# Patient Record
Sex: Female | Born: 1955
Health system: Southern US, Community
[De-identification: ages and names within clinical notes are randomized; demographics above are authoritative.]

## PROBLEM LIST (undated history)

## (undated) DIAGNOSIS — I10 Essential (primary) hypertension: Secondary | ICD-10-CM

## (undated) DIAGNOSIS — M1990 Unspecified osteoarthritis, unspecified site: Secondary | ICD-10-CM

## (undated) DIAGNOSIS — S82143A Displaced bicondylar fracture of unspecified tibia, initial encounter for closed fracture: Secondary | ICD-10-CM

## (undated) HISTORY — DX: Displaced bicondylar fracture of unspecified tibia, initial encounter for closed fracture: S82.143A

---

## 2011-09-10 ENCOUNTER — Encounter: Payer: Self-pay | Admitting: Emergency Medicine

## 2011-09-10 ENCOUNTER — Emergency Department (HOSPITAL_COMMUNITY)
Admission: EM | Admit: 2011-09-10 | Discharge: 2011-09-10 | Disposition: A | Discharge status: Home / Self Care | Payer: Self-pay | Attending: Emergency Medicine | Admitting: Emergency Medicine

## 2011-09-10 DIAGNOSIS — G8929 Other chronic pain: Secondary | ICD-10-CM

## 2011-09-10 DIAGNOSIS — M25569 Pain in unspecified knee: Secondary | ICD-10-CM | POA: Insufficient documentation

## 2011-09-10 MED ORDER — TRAMADOL HCL 50 MG PO TABS: 50.0000 mg | ORAL_TABLET | Freq: Four times a day (QID) | ORAL | Status: AC | PRN

## 2011-09-10 NOTE — ED Notes (Signed)
Pt c/o pain in left knee x several weeks that became worse over last couple of days; pt denies injury; pt with limited english but family with pt

## 2011-09-10 NOTE — ED Provider Notes (Signed)
History     CSN: [id number]  Arrival date & time 09/10/11  4098   First MD Initiated Contact with Patient 09/10/11 1105      Chief Complaint  Patient presents with  . Leg Pain    (Consider location/radiation/quality/duration/timing/severity/associated sxs/prior treatment) HPI Comments: Patient is and chronic right knee pain for several months. Apparently in [place] she had fluid drawn out of her knee approximately 4 months ago which led to mild temporary improvement of her symptoms. She reports the fluid was white. She did not have fevers. There is no reported recent injury. Patient had severe pain last night but has improved today. She is seeing some doctor and she is currently on Voltaren. She also has had injections to her knee in the past. She was getting some relief with double tearing, however now it is not as effective. Family member reports that she's not able to sleep much at all last night due to pain. She reports no fever. She reports at times her knee has gotten red and pain usually is worse with long periods of standing and walking. She has not had calf swelling or tenderness. No pleuritic chest pain. No shortness of breath. Currently there is no redness, fever, numbness or weakness of her leg.  Patient is a 56 y.o. female presenting with leg pain. The history is provided by a relative and the patient. The history is limited by a language barrier.  Leg Pain  Pertinent negatives include no numbness.    History reviewed. No pertinent past medical history.  History reviewed. No pertinent past surgical history.  History reviewed. No pertinent family history.  History  Substance Use Topics  . Smoking status: Never Smoker   . Smokeless tobacco: Not on file  . Alcohol Use: No    OB History    Grav Para Term Preterm Abortions TAB SAB Ect Mult Living                  Review of Systems  Constitutional: Negative for fever.  Musculoskeletal: Positive for arthralgias.  Negative for joint swelling.  Neurological: Negative for weakness and numbness.    Allergies  Review of patient's allergies indicates no known allergies.  Home Medications   Current Outpatient Rx  Name Route Sig Dispense Refill  . DICLOFENAC SODIUM 75 MG PO TBEC Oral Take 75 mg by mouth 2 (two) times daily.      . TRAMADOL HCL 50 MG PO TABS Oral Take 1 tablet (50 mg total) by mouth every 6 (six) hours as needed for pain. 15 tablet 0    BP 165/99  Pulse 73  Temp(Src) 97.7 F (36.5 C) (Oral)  Resp 16  SpO2 100%  Physical Exam  Nursing note and vitals reviewed. Constitutional: She appears well-developed and well-nourished. No distress.  Musculoskeletal:       No swelling or deformity of her right knee. There is no rash. There is no obvious effusion. There is full range of motion. Negative Homans sign. No calf swelling or tenderness.  Neurological: She is alert. She has normal strength.  Skin: Skin is warm and dry. No rash noted.    ED Course  Procedures (including critical care time)  Labs Reviewed - No data to display No results found.   1. Chronic knee pain       MDM  Based on description, patient may have pseudogout given the location in the knee and no other joint problems in the past. If the fluid that was  drawn off her knee indeed was white, that applies crystallization. However given that some doctor has been given her injections, there is a possibility of just chronic ongoing degenerative arthritis. Follow up instructions as given to patient and son to follow up with an orthopedist or sports/family physician. Otherwise have given a prescription for tramadol for more severe pain on top of her usual use of Voltaren.       Gavin Pound. Sebastien Jackson, MD 09/10/11 1153

## 2011-09-10 NOTE — Discharge Instructions (Signed)
 I have given you information for the 2 conditions that I think you may have.  Either way, continue taking the Voltaren, take Ultram  for more severe pain and follow up with sports clinic for long term solutions and management.

## 2011-09-18 ENCOUNTER — Ambulatory Visit (HOSPITAL_COMMUNITY)
Admission: RE | Admit: 2011-09-18 | Discharge: 2011-09-18 | Disposition: A | Discharge status: Home / Self Care | Payer: Self-pay | Source: Ambulatory Visit | Attending: Family Medicine | Admitting: Family Medicine

## 2011-09-18 ENCOUNTER — Ambulatory Visit (INDEPENDENT_AMBULATORY_CARE_PROVIDER_SITE_OTHER): Payer: Self-pay | Admitting: Family Medicine

## 2011-09-18 VITALS — BP 150/90 | Ht 69.0 in | Wt 180.0 lb

## 2011-09-18 DIAGNOSIS — X58XXXA Exposure to other specified factors, initial encounter: Secondary | ICD-10-CM | POA: Insufficient documentation

## 2011-09-18 DIAGNOSIS — S82109A Unspecified fracture of upper end of unspecified tibia, initial encounter for closed fracture: Secondary | ICD-10-CM | POA: Insufficient documentation

## 2011-09-18 DIAGNOSIS — M25569 Pain in unspecified knee: Secondary | ICD-10-CM

## 2011-09-18 DIAGNOSIS — M25561 Pain in right knee: Secondary | ICD-10-CM

## 2011-09-18 NOTE — Progress Notes (Signed)
Fluid aspirated from pt's rt knee showed no crystals and few white cells per Bonnie [place], at [facility].

## 2011-09-18 NOTE — Progress Notes (Signed)
Patient Name: Savannah Reese Date of Birth: 01/31/1956 Age: 56 y.o. Medical Record Number: [id number] Gender: female Date of Encounter: 09/18/2011  History of Present Illness:  Savannah Reese is a 56 y.o. very pleasant female patient who presents with the following:  R knee effusion and pain x 10 mo asp  Pleasant African patient from [place] who presents with a 10 month history of subacute RIGHT knee pain. She has no known history of trauma. No known injury. She was seen in [place] by a physician in [place] for 5 months ago. Reportedly she had a aspiration at that point, and the fluid may have been cloudy of some sort. History is complicated by lack of English, that we do have a [place] in the office with her.  She just moved to [place]. She was seen in the emergency room approximately one week ago. She was given some oral anti-inflammatories and pain medicine and asked to followup in the outpatient setting.  She denies a history of gout, pseudogout, or any significant trauma, fracture, or operative intervention the affected joint.  Past Medical History, Surgical History, Social History, Family History, Problem List, Medications, and Allergies have been reviewed and updated if relevant.  Review of Systems:  GEN: No fevers, chills. Nontoxic. Primarily MSK c/o today. MSK: Detailed in the HPI GI: tolerating PO intake without difficulty Neuro: No numbness, parasthesias, or tingling associated. Otherwise the pertinent positives of the ROS are noted above.    Physical Examination: Filed Vitals:   09/18/11 1547  BP: 150/90  Height: 5\' 9"  (1.753 m)  Weight: 180 lb (81.647 kg)    Body mass index is 26.58 kg/(m^2).   GEN: WDWN, NAD, Non-toxic, Alert & Oriented x 3 HEENT: Atraumatic, Normocephalic.  Ears and Nose: No external deformity. EXTR: No clubbing/cyanosis/edema PSYCH: Not depressed or anxious appearing.  Calm demeanor.   RIGHT knee: Large effusion. Nontender with  patellar motion. Nontender with loading the patellar facets. Mildly tender on the medial joint line. Stable to varus and valgus stress, which produces minimal to no pain. Negative anterior and posterior drawer testing. The Murray's causes significant pain. Unable to complete flexion pinch testing due to pain.  Full extension and flexion to 90. Nontender distally along the tibia and fibula. Quadriceps and hamstrings are intact. Strength testing is 5/5 in all of the lower extremity Assessment and Plan: 1. Knee pain, right  DG Knee Bilateral Standing AP, DG Knee 1-2 Views Right    At the time of her office visit, differential diagnosis primarily consisted of osteoarthritis versus gout versus pseudogout. Joint aspirate was examined, and the patient has no crystals.  At the time of aspirate, 9 cc of lidocaine 1% and 1 cc of Depo-Medrol 40 mg was injected with good pain relief.  Knee Aspiration and Injection, Right Patient verbally consented; risks, benefits, and alternatives explained including possible infection. Patient prepped with Chloraprep. Ethyl chloride for anesthesia. 10 cc of 1% Lidocaine used in wheal then injected Subcutaneous fashion with 27 gauge needle on lateral approach. Under sterilne conditions, 18 gauge needle used via lateral approach to aspirate 30 cc of serosanguinous fluid. Then 9 cc of Lidocaine 1% and 1 cc of Depo-Medrol 40 mg injected. Tolerated well, decreased pain, no complications.   After the patient's office visit, plain trauma series was ordered, for review, RIGHT knee. 4 views. There is evidence of an old, subacute medial tibial plateau fracture with some degree of displacement medially and posteriorly.  The patient has no memory of any trauma  or injury. I am going to discuss this case with some of my colleagues - the old / subacute aspect makes more complex case.

## 2011-09-26 ENCOUNTER — Telehealth: Payer: Self-pay | Admitting: *Deleted

## 2011-09-26 NOTE — Telephone Encounter (Signed)
Discussed results with pt's family member as she is non-english speaking.  He will bring pt for appointment with Dr. Patsy Lager 10/02/11 at 3pm for further tx.

## 2011-09-26 NOTE — Telephone Encounter (Signed)
Message copied by Mora Bellman on Wed Sep 26, 2011 10:41 AM ------      Message from: Hannah Beat      Created: Tue Sep 25, 2011  2:43 PM       Can we get her to follow-up next week?            I want to go over everything with her and her family member who speaks english      It looks like she has an old fracture in her knee that we will need to try to work with to support             No crystals in joint at all (definitely no gout)

## 2011-10-02 ENCOUNTER — Ambulatory Visit (INDEPENDENT_AMBULATORY_CARE_PROVIDER_SITE_OTHER): Payer: Self-pay | Admitting: Family Medicine

## 2011-10-02 VITALS — BP 141/89

## 2011-10-02 DIAGNOSIS — S82143A Displaced bicondylar fracture of unspecified tibia, initial encounter for closed fracture: Secondary | ICD-10-CM

## 2011-10-02 DIAGNOSIS — S82109A Unspecified fracture of upper end of unspecified tibia, initial encounter for closed fracture: Secondary | ICD-10-CM

## 2011-10-02 MED ORDER — TRAMADOL HCL 50 MG PO TABS: 50.0000 mg | ORAL_TABLET | Freq: Four times a day (QID) | ORAL | Status: AC | PRN

## 2011-10-02 MED ORDER — TRAMADOL HCL 50 MG PO TABS: 50.0000 mg | ORAL_TABLET | Freq: Four times a day (QID) | ORAL | Status: DC | PRN

## 2011-10-05 NOTE — Progress Notes (Signed)
  Patient Name: Savannah Reese Date of Birth: 1956/01/23 Age: 56 y.o. Medical Record Number: [id number] Gender: female Date of Encounter: 10/02/2011  History of Present Illness:  Savannah Reese is a 56 y.o. very pleasant female patient who presents with the following:  Interpretation from [place].  The patient is brought back into the office to discuss her recent office visit and x-rays. X-rays of the knee, RIGHT, on last office visit showed a mildly displaced medial tibial plateau fracture. Given that the patient has been having significant knee pain for a year and the appearance of the x-ray on plain film, we discussed that this is almost certainly an old fracture that is not fully healed and gone on to bony union. The patient actually feels quite a bit better. Her knee motion is improved. At her last office visit I aspirated her knee, there were no crystals, and we injected some steroids in her knee. Feels quite a bit better she is ambulating much more easily.  Past Medical History, Surgical History, Social History, Family History, Problem List, Medications, and Allergies have been reviewed and updated if relevant.  Review of Systems:  GEN: No fevers, chills. Nontoxic. Primarily MSK c/o today. MSK: Detailed in the HPI GI: tolerating PO intake without difficulty Neuro: No numbness, parasthesias, or tingling associated. Otherwise the pertinent positives of the ROS are noted above.    Physical Examination: Filed Vitals:   10/02/11 1513  BP: 141/89    There is no height or weight on file to calculate BMI.   GEN: WDWN, NAD, Non-toxic, Alert & Oriented x 3 HEENT: Atraumatic, Normocephalic.  Ears and Nose: No external deformity. EXTR: No clubbing/cyanosis/edema NEURO: Normal gait.  PSYCH: Normally interactive. Conversant. Not depressed or anxious appearing.  Calm demeanor.   RIGHT knee: Full extension and flexion to 120. Minimal effusion. Very mild tenderness along the medial joint  line. Stable to varus and obvious stress. Negative Lachman. Negative posterior drawer testing. Negative bounce test.  Assessment and Plan:  1. Fracture of tibial plateau, closed     Date of injury approximately 10 months ago. I suspect that the patient probably had a tibial plateau stress fracture, and we reviewed again her history. She had no history of trauma. We discussed that I suspected that she had a tibial plateau stress fracture and it ultimately went on to an occult fracture. The patient was in [place] at the time. Her 1st x-rays were last week. At this point, she is ambulating with minimal difficulty and her knee actually feels better. I did my best to discuss with her through her cousin that this knee is potentially going to bother her in the future, and this is an intra-articular fracture, and that will predispose her to an acceleration of arthritis. 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 She is going to come back into our office in 6 months or so when she returns from [place], where she will be for at least 6 months. At that point we can recheck another x-ray to see if there is some delayed osseous union. She indicated that she understood everything. For her pain, also gave her some Ultram to use on a p.r.n. basis

## 2011-12-04 ENCOUNTER — Ambulatory Visit (HOSPITAL_COMMUNITY)
Admission: RE | Admit: 2011-12-04 | Discharge: 2011-12-04 | Disposition: A | Discharge status: Home / Self Care | Payer: Self-pay | Source: Ambulatory Visit | Attending: Family Medicine | Admitting: Family Medicine

## 2011-12-04 ENCOUNTER — Ambulatory Visit (INDEPENDENT_AMBULATORY_CARE_PROVIDER_SITE_OTHER): Payer: Self-pay | Admitting: Family Medicine

## 2011-12-04 ENCOUNTER — Encounter: Payer: Self-pay | Admitting: Family Medicine

## 2011-12-04 VITALS — BP 140/90

## 2011-12-04 DIAGNOSIS — S82143A Displaced bicondylar fracture of unspecified tibia, initial encounter for closed fracture: Secondary | ICD-10-CM

## 2011-12-04 DIAGNOSIS — Z4789 Encounter for other orthopedic aftercare: Secondary | ICD-10-CM | POA: Insufficient documentation

## 2011-12-04 DIAGNOSIS — S82109A Unspecified fracture of upper end of unspecified tibia, initial encounter for closed fracture: Secondary | ICD-10-CM

## 2011-12-04 HISTORY — DX: Displaced bicondylar fracture of unspecified tibia, initial encounter for closed fracture: S82.143A

## 2011-12-04 MED ORDER — NAPROXEN 500 MG PO TABS: 500.0000 mg | ORAL_TABLET | Freq: Two times a day (BID) | ORAL | Status: AC

## 2011-12-04 NOTE — Patient Instructions (Signed)
  Alleve 2 tabs by mouth two times a day over the counter: This is equal to a prescripton strength dose that I wrote and with refills (GENERIC CHEAPER EQUIVALENT IS NAPROXEN SODIUM)   Naproxen Sodium 220 mg, 2 tablets twice a day is almost equal to 1 tablet of the prescription twice a day.  If you want to get enough before you go to [place], you can buy bottles of the generic naproxen sodium at [facility]

## 2011-12-04 NOTE — Progress Notes (Signed)
  Patient Name: Savannah Reese Date of Birth: 12-Oct-1955 Age: 56 y.o. Medical Record Number: [id number] Gender: female Date of Encounter: 12/04/2011  History of Present Illness:  Savannah Reese is a 56 y.o. very pleasant female patient who presents with the following:  Right knee prior tibial plateau fracture distantly that occurred in [place] greater than a year ago, delayed diagnosis several months ago. We placed her in a hinged knee brace and gave her some anti-inflammatories and were planning on following this over time. The patient's pain has diminished considerably, but she has had a slight flare up over the last few days. She does have a small amount of fluid on her knee. She has not had any giving way, and she is getting around well wearing her knee brace when she is up and annual waiting for her daily activities.  I presented she probably had a tibial plateau stress fracture, and then completed to a tibial plateau fracture with some mild displacement.  Past Medical History, Surgical History, Social History, Family History, Problem List, Medications, and Allergies have been reviewed and updated if relevant.  Review of Systems:  GEN: No fevers, chills. Nontoxic. Primarily MSK c/o today. MSK: Detailed in the HPI GI: tolerating PO intake without difficulty Neuro: No numbness, parasthesias, or tingling associated. Otherwise the pertinent positives of the ROS are noted above.    Physical Examination: Filed Vitals:   12/04/11 1615  BP: 140/90    There is no height or weight on file to calculate BMI.   GEN: WDWN, NAD, Non-toxic, Alert & Oriented x 3 HEENT: Atraumatic, Normocephalic.  Ears and Nose: No external deformity. EXTR: No clubbing/cyanosis/edema PSYCH: Normally interactive. Conversant. Not depressed or anxious appearing.  Calm demeanor.   Right knee: Mild effusion. Minimal tenderness on the medial joint line. Nontender in the lateral joint line. Full extension. Flexion to  115. Negative anterior and posterior drawer test. Neg mcmurrays.  Assessment and Plan: 1. Closed fracture of tibial plateau  DG Knee Complete 4 Views Right    Clinically, the patient is doing much better compared to her prior evaluations. She is much better motion, significantly less fluid, and much less joint line pain. Clinically this would seem to correspond to some interval healing of her prior distant fracture. The least her knee is much more calm down.  We will recheck a knee series to see if she is having some callus formation or bony union

## 2011-12-06 ENCOUNTER — Encounter: Payer: Self-pay | Admitting: *Deleted

## 2014-03-11 ENCOUNTER — Other Ambulatory Visit: Payer: Self-pay | Admitting: Orthopedic Surgery

## 2014-03-24 NOTE — Pre-Procedure Instructions (Signed)
Mateo FlowVeuve Niandou  03/24/2014   Your procedure is scheduled on:  04/05/14  [facility] at 630 AM.  Call this number if you have problems the morning of surgery: [phone]   Remember:   Do not eat food or drink liquids after midnight.   Take these medicines the morning of surgery with A SIP OF WATER: none   Do not wear jewelry, make-up or nail polish.  Do not wear lotions, powders, or perfumes. You may wear deodorant.  Do not shave 48 hours prior to surgery. Men may shave face and neck.  Do not bring valuables to the hospital.  [facility] is not responsible                  for any belongings or valuables.               Contacts, dentures or bridgework may not be worn into surgery.  Leave suitcase in the car. After surgery it may be brought to your room.  For patients admitted to the hospital, discharge time is determined by your                treatment team.               Patients discharged the day of surgery will not be allowed to drive  home.  Name and phone number of your driver: family  Special Instructions: Incentive Spirometry - Practice and bring it with you on the day of surgery.   Please read over the following fact sheets that you were given: Pain Booklet, Coughing and Deep Breathing, Blood Transfusion Information, MRSA Information and Surgical Site Infection Prevention

## 2014-03-25 ENCOUNTER — Encounter (HOSPITAL_COMMUNITY): Payer: Self-pay

## 2014-03-25 ENCOUNTER — Ambulatory Visit (HOSPITAL_COMMUNITY)
Admission: RE | Admit: 2014-03-25 | Discharge: 2014-03-25 | Disposition: A | Discharge status: Home / Self Care | Payer: Self-pay | Source: Ambulatory Visit | Attending: Orthopedic Surgery | Admitting: Orthopedic Surgery

## 2014-03-25 ENCOUNTER — Encounter (HOSPITAL_COMMUNITY)
Admission: RE | Admit: 2014-03-25 | Discharge: 2014-03-25 | Disposition: A | Discharge status: Home / Self Care | Payer: Self-pay | Source: Ambulatory Visit | Attending: Orthopedic Surgery | Admitting: Orthopedic Surgery

## 2014-03-25 ENCOUNTER — Encounter (HOSPITAL_COMMUNITY): Payer: Self-pay | Admitting: Pharmacy Technician

## 2014-03-25 DIAGNOSIS — Z01810 Encounter for preprocedural cardiovascular examination: Secondary | ICD-10-CM | POA: Insufficient documentation

## 2014-03-25 DIAGNOSIS — E669 Obesity, unspecified: Secondary | ICD-10-CM | POA: Insufficient documentation

## 2014-03-25 DIAGNOSIS — Z01818 Encounter for other preprocedural examination: Secondary | ICD-10-CM | POA: Insufficient documentation

## 2014-03-25 DIAGNOSIS — I10 Essential (primary) hypertension: Secondary | ICD-10-CM | POA: Insufficient documentation

## 2014-03-25 DIAGNOSIS — M4120 Other idiopathic scoliosis, site unspecified: Secondary | ICD-10-CM | POA: Insufficient documentation

## 2014-03-25 HISTORY — DX: Essential (primary) hypertension: I10

## 2014-03-25 HISTORY — DX: Unspecified osteoarthritis, unspecified site: M19.90

## 2014-03-25 LAB — URINALYSIS, ROUTINE W REFLEX MICROSCOPIC
Bilirubin Urine: NEGATIVE
Glucose, UA: NEGATIVE mg/dL
HGB URINE DIPSTICK: NEGATIVE
KETONES UR: NEGATIVE mg/dL
Leukocytes, UA: NEGATIVE
NITRITE: NEGATIVE
PROTEIN: NEGATIVE mg/dL
SPECIFIC GRAVITY, URINE: 1.015 (ref 1.005–1.030)
Urobilinogen, UA: 0.2 mg/dL (ref 0.0–1.0)
pH: 5.5 (ref 5.0–8.0)

## 2014-03-25 LAB — COMPREHENSIVE METABOLIC PANEL
ALT: 8 U/L (ref 0–35)
AST: 13 U/L (ref 0–37)
Albumin: 3.8 g/dL (ref 3.5–5.2)
Alkaline Phosphatase: 43 U/L (ref 39–117)
Anion gap: 14 (ref 5–15)
BUN: 35 mg/dL — AB (ref 6–23)
CALCIUM: 9.1 mg/dL (ref 8.4–10.5)
CO2: 24 meq/L (ref 19–32)
Chloride: 105 mEq/L (ref 96–112)
Creatinine, Ser: 1.26 mg/dL — ABNORMAL HIGH (ref 0.50–1.10)
GFR calc non Af Amer: 45 mL/min — ABNORMAL LOW (ref >90)
GFR, EST AFRICAN AMERICAN: 53 mL/min — AB (ref >90)
Glucose, Bld: 122 mg/dL — ABNORMAL HIGH (ref 70–99)
Potassium: 3.9 mEq/L (ref 3.7–5.3)
Sodium: 143 mEq/L (ref 137–147)
Total Protein: 7.7 g/dL (ref 6.0–8.3)

## 2014-03-25 LAB — ABO/RH: ABO/RH(D): A POS

## 2014-03-25 LAB — CBC WITH DIFFERENTIAL/PLATELET
BASOS ABS: 0 10*3/uL (ref 0.0–0.1)
Basophils Relative: 0 % (ref 0–1)
EOS PCT: 3 % (ref 0–5)
Eosinophils Absolute: 0.2 10*3/uL (ref 0.0–0.7)
HEMATOCRIT: 37.2 % (ref 36.0–46.0)
HEMOGLOBIN: 12.2 g/dL (ref 12.0–15.0)
Lymphocytes Relative: 31 % (ref 12–46)
Lymphs Abs: 2.1 10*3/uL (ref 0.7–4.0)
MCH: 28.3 pg (ref 26.0–34.0)
MCHC: 32.8 g/dL (ref 30.0–36.0)
MCV: 86.3 fL (ref 78.0–100.0)
MONOS PCT: 6 % (ref 3–12)
Monocytes Absolute: 0.4 10*3/uL (ref 0.1–1.0)
Neutro Abs: 4.1 10*3/uL (ref 1.7–7.7)
Neutrophils Relative %: 60 % (ref 43–77)
Platelets: 260 10*3/uL (ref 150–400)
RBC: 4.31 MIL/uL (ref 3.87–5.11)
RDW: 13.7 % (ref 11.5–15.5)
WBC: 6.8 10*3/uL (ref 4.0–10.5)

## 2014-03-25 LAB — APTT: aPTT: 25 seconds (ref 24–37)

## 2014-03-25 LAB — TYPE AND SCREEN
ABO/RH(D): A POS
ANTIBODY SCREEN: NEGATIVE

## 2014-03-25 LAB — PROTIME-INR
INR: 0.93 (ref 0.00–1.49)
PROTHROMBIN TIME: 12.5 s (ref 11.6–15.2)

## 2014-03-25 LAB — SURGICAL PCR SCREEN
MRSA, PCR: NEGATIVE
STAPHYLOCOCCUS AUREUS: NEGATIVE

## 2014-03-25 MED ORDER — SODIUM CHLORIDE 0.9 % IV SOLN: INTRAVENOUS | Status: DC

## 2014-03-25 MED ORDER — CHLORHEXIDINE GLUCONATE 4 % EX LIQD: 60.0000 mL | Freq: Once | CUTANEOUS | Status: DC

## 2014-03-26 NOTE — Progress Notes (Signed)
Anesthesia Chart Review:  Patient is a 58 year old female scheduled for right TKA on 05/02/14 by Dr. Sherlean FootLucey.  History includes non-smoker, HTN, arthritis.  BMI is consistent with obesity.  No PCP is listed.  EKG on 03/25/14 showed SNR, cannot rule out anterior infarct (age undetermined). There are no comparison EKGs available.  CXR on 03/25/14 showed: No active disease. Dextroscoliosis lower thoracic spine.  Preoperative labs noted.   PAT RN notes indicate that her cousin was used to interpret during her PAT interview.  No CV symptoms documented.  No reported CAD, DM, CHF, or MI history.  Further evaluation by her assigned anesthesiologist on the day of surgery. If no acute changes then I would anticipate that she could proceed as planned.  Savannah Ochsllison Dorion Petillo, PA-C [facility]/Anesthesiology Phone [phone] 03/26/2014 12:25 PM

## 2014-04-04 MED ORDER — BUPIVACAINE LIPOSOME 1.3 % IJ SUSP
20.0000 mL | Freq: Once | INTRAMUSCULAR | Status: DC
  Filled 2014-04-04: qty 20

## 2014-04-04 MED ORDER — CEFAZOLIN SODIUM-DEXTROSE 2-3 GM-% IV SOLR
2.0000 g | INTRAVENOUS | Status: DC
  Filled 2014-04-04: qty 50

## 2014-04-04 MED ORDER — TRANEXAMIC ACID 100 MG/ML IV SOLN
1000.0000 mg | INTRAVENOUS | Status: AC
  Administered 2014-04-05: 1000 mg via INTRAVENOUS
  Filled 2014-04-04: qty 10

## 2014-04-05 ENCOUNTER — Encounter (HOSPITAL_COMMUNITY): Payer: Self-pay | Admitting: Vascular Surgery

## 2014-04-05 ENCOUNTER — Inpatient Hospital Stay (HOSPITAL_COMMUNITY)
Admission: RE | Admit: 2014-04-05 | Discharge: 2014-04-08 | DRG: 470 | Disposition: A | Discharge status: Home Health | Payer: Self-pay | Source: Ambulatory Visit | Attending: Orthopedic Surgery | Admitting: Orthopedic Surgery

## 2014-04-05 ENCOUNTER — Encounter (HOSPITAL_COMMUNITY): Payer: Self-pay | Admitting: *Deleted

## 2014-04-05 ENCOUNTER — Inpatient Hospital Stay (HOSPITAL_COMMUNITY): Payer: Self-pay | Admitting: Anesthesiology

## 2014-04-05 ENCOUNTER — Encounter (HOSPITAL_COMMUNITY)
Admission: RE | Disposition: A | Discharge status: Home Health | Payer: Self-pay | Source: Ambulatory Visit | Attending: Orthopedic Surgery

## 2014-04-05 DIAGNOSIS — M1710 Unilateral primary osteoarthritis, unspecified knee: Principal | ICD-10-CM | POA: Diagnosis present

## 2014-04-05 DIAGNOSIS — D62 Acute posthemorrhagic anemia: Secondary | ICD-10-CM | POA: Diagnosis not present

## 2014-04-05 DIAGNOSIS — I10 Essential (primary) hypertension: Secondary | ICD-10-CM | POA: Diagnosis present

## 2014-04-05 DIAGNOSIS — Z96651 Presence of right artificial knee joint: Secondary | ICD-10-CM

## 2014-04-05 DIAGNOSIS — Z96659 Presence of unspecified artificial knee joint: Secondary | ICD-10-CM

## 2014-04-05 HISTORY — PX: TOTAL KNEE ARTHROPLASTY: SHX125

## 2014-04-05 LAB — URINE CULTURE: Colony Count: 5000

## 2014-04-05 LAB — CREATININE, SERUM
Creatinine, Ser: 1.05 mg/dL (ref 0.50–1.10)
GFR calc Af Amer: 66 mL/min — ABNORMAL LOW (ref >90)
GFR calc non Af Amer: 57 mL/min — ABNORMAL LOW (ref >90)

## 2014-04-05 LAB — CBC
HCT: 36.8 % (ref 36.0–46.0)
HEMOGLOBIN: 12 g/dL (ref 12.0–15.0)
MCH: 28.2 pg (ref 26.0–34.0)
MCHC: 32.6 g/dL (ref 30.0–36.0)
MCV: 86.4 fL (ref 78.0–100.0)
PLATELETS: 270 10*3/uL (ref 150–400)
RBC: 4.26 MIL/uL (ref 3.87–5.11)
RDW: 14 % (ref 11.5–15.5)
WBC: 10.1 10*3/uL (ref 4.0–10.5)

## 2014-04-05 SURGERY — ARTHROPLASTY, KNEE, TOTAL
Anesthesia: General | Site: Knee | Laterality: Right

## 2014-04-05 MED ORDER — SODIUM CHLORIDE 0.9 % IR SOLN
Status: DC | PRN
  Administered 2014-04-05: 3000 mL

## 2014-04-05 MED ORDER — FENTANYL CITRATE 0.05 MG/ML IJ SOLN
INTRAMUSCULAR | Status: AC
  Filled 2014-04-05: qty 2

## 2014-04-05 MED ORDER — LISINOPRIL 20 MG PO TABS
20.0000 mg | ORAL_TABLET | Freq: Every day | ORAL | Status: DC
  Administered 2014-04-05 – 2014-04-08 (×4): 20 mg via ORAL
  Filled 2014-04-05 (×4): qty 1

## 2014-04-05 MED ORDER — DIPHENHYDRAMINE HCL 12.5 MG/5ML PO ELIX: 12.5000 mg | ORAL_SOLUTION | ORAL | Status: DC | PRN

## 2014-04-05 MED ORDER — MIDAZOLAM HCL 2 MG/2ML IJ SOLN
2.0000 mg | Freq: Once | INTRAMUSCULAR | Status: DC
  Filled 2014-04-05: qty 2

## 2014-04-05 MED ORDER — MEPERIDINE HCL 25 MG/ML IJ SOLN: 6.2500 mg | INTRAMUSCULAR | Status: DC | PRN

## 2014-04-05 MED ORDER — 0.9 % SODIUM CHLORIDE (POUR BTL) OPTIME
TOPICAL | Status: DC | PRN
  Administered 2014-04-05: 1000 mL

## 2014-04-05 MED ORDER — PROPOFOL 10 MG/ML IV BOLUS
INTRAVENOUS | Status: AC
  Filled 2014-04-05: qty 20

## 2014-04-05 MED ORDER — METHOCARBAMOL 500 MG PO TABS
500.0000 mg | ORAL_TABLET | Freq: Four times a day (QID) | ORAL | Status: DC | PRN
  Administered 2014-04-06 – 2014-04-07 (×4): 500 mg via ORAL
  Filled 2014-04-05 (×5): qty 1

## 2014-04-05 MED ORDER — METHOCARBAMOL 1000 MG/10ML IJ SOLN
500.0000 mg | Freq: Four times a day (QID) | INTRAMUSCULAR | Status: DC | PRN
  Filled 2014-04-05: qty 5

## 2014-04-05 MED ORDER — LIDOCAINE HCL (CARDIAC) 20 MG/ML IV SOLN
INTRAVENOUS | Status: DC | PRN
  Administered 2014-04-05: 100 mg via INTRAVENOUS

## 2014-04-05 MED ORDER — SODIUM CHLORIDE 0.9 % IV SOLN
INTRAVENOUS | Status: DC | PRN
  Administered 2014-04-05 (×2): via INTRAVENOUS

## 2014-04-05 MED ORDER — ONDANSETRON HCL 4 MG/2ML IJ SOLN
INTRAMUSCULAR | Status: DC | PRN
  Administered 2014-04-05: 4 mg via INTRAVENOUS

## 2014-04-05 MED ORDER — FLEET ENEMA 7-19 GM/118ML RE ENEM: 1.0000 | ENEMA | Freq: Once | RECTAL | Status: AC | PRN

## 2014-04-05 MED ORDER — OXYCODONE HCL ER 10 MG PO T12A
10.0000 mg | EXTENDED_RELEASE_TABLET | Freq: Two times a day (BID) | ORAL | Status: DC
  Administered 2014-04-05 – 2014-04-08 (×7): 10 mg via ORAL
  Filled 2014-04-05 (×7): qty 1

## 2014-04-05 MED ORDER — ONDANSETRON HCL 4 MG PO TABS: 4.0000 mg | ORAL_TABLET | Freq: Four times a day (QID) | ORAL | Status: DC | PRN

## 2014-04-05 MED ORDER — ATENOLOL-CHLORTHALIDONE 50-25 MG PO TABS: 1.0000 | ORAL_TABLET | Freq: Every day | ORAL | Status: DC

## 2014-04-05 MED ORDER — BISACODYL 5 MG PO TBEC: 5.0000 mg | DELAYED_RELEASE_TABLET | Freq: Every day | ORAL | Status: DC | PRN

## 2014-04-05 MED ORDER — STERILE WATER FOR INJECTION IJ SOLN
INTRAMUSCULAR | Status: AC
  Filled 2014-04-05: qty 10

## 2014-04-05 MED ORDER — MENTHOL 3 MG MT LOZG
1.0000 | LOZENGE | OROMUCOSAL | Status: DC | PRN
Start: 2014-04-05 — End: 2014-04-08

## 2014-04-05 MED ORDER — DEXAMETHASONE SODIUM PHOSPHATE 4 MG/ML IJ SOLN
INTRAMUSCULAR | Status: AC
  Filled 2014-04-05: qty 1

## 2014-04-05 MED ORDER — DOCUSATE SODIUM 100 MG PO CAPS
100.0000 mg | ORAL_CAPSULE | Freq: Two times a day (BID) | ORAL | Status: DC
  Administered 2014-04-05 – 2014-04-08 (×7): 100 mg via ORAL
  Filled 2014-04-05 (×8): qty 1

## 2014-04-05 MED ORDER — PHENOL 1.4 % MT LIQD: 1.0000 | OROMUCOSAL | Status: DC | PRN

## 2014-04-05 MED ORDER — ACETAMINOPHEN 650 MG RE SUPP: 650.0000 mg | Freq: Four times a day (QID) | RECTAL | Status: DC | PRN

## 2014-04-05 MED ORDER — DEXAMETHASONE SODIUM PHOSPHATE 4 MG/ML IJ SOLN
INTRAMUSCULAR | Status: DC | PRN
  Administered 2014-04-05: 4 mg via INTRAVENOUS

## 2014-04-05 MED ORDER — SUCCINYLCHOLINE CHLORIDE 20 MG/ML IJ SOLN
INTRAMUSCULAR | Status: AC
  Filled 2014-04-05: qty 1

## 2014-04-05 MED ORDER — OXYCODONE HCL 5 MG PO TABS: 5.0000 mg | ORAL_TABLET | Freq: Once | ORAL | Status: DC | PRN

## 2014-04-05 MED ORDER — OXYCODONE HCL 5 MG PO TABS
5.0000 mg | ORAL_TABLET | ORAL | Status: DC | PRN
  Administered 2014-04-05 – 2014-04-08 (×13): 10 mg via ORAL
  Filled 2014-04-05 (×13): qty 2

## 2014-04-05 MED ORDER — ATENOLOL 50 MG PO TABS
50.0000 mg | ORAL_TABLET | Freq: Every day | ORAL | Status: DC
  Administered 2014-04-06 – 2014-04-08 (×3): 50 mg via ORAL
  Filled 2014-04-05 (×3): qty 1

## 2014-04-05 MED ORDER — SODIUM CHLORIDE 0.9 % IV SOLN
INTRAVENOUS | Status: DC
Start: 2014-04-05 — End: 2014-04-08
  Administered 2014-04-05 – 2014-04-06 (×3): via INTRAVENOUS

## 2014-04-05 MED ORDER — ARTIFICIAL TEARS OP OINT
TOPICAL_OINTMENT | OPHTHALMIC | Status: AC
  Filled 2014-04-05: qty 10.5

## 2014-04-05 MED ORDER — PHENYLEPHRINE HCL 10 MG/ML IJ SOLN
INTRAMUSCULAR | Status: DC | PRN
  Administered 2014-04-05 (×2): 40 ug via INTRAVENOUS

## 2014-04-05 MED ORDER — METOCLOPRAMIDE HCL 5 MG/ML IJ SOLN: 5.0000 mg | Freq: Three times a day (TID) | INTRAMUSCULAR | Status: DC | PRN

## 2014-04-05 MED ORDER — FENTANYL CITRATE 0.05 MG/ML IJ SOLN
INTRAMUSCULAR | Status: DC | PRN
  Administered 2014-04-05: 50 ug via INTRAVENOUS
  Administered 2014-04-05 (×8): 25 ug via INTRAVENOUS

## 2014-04-05 MED ORDER — BUPIVACAINE-EPINEPHRINE (PF) 0.5% -1:200000 IJ SOLN
INTRAMUSCULAR | Status: AC
  Filled 2014-04-05: qty 30

## 2014-04-05 MED ORDER — EPHEDRINE SULFATE 50 MG/ML IJ SOLN
INTRAMUSCULAR | Status: AC
  Filled 2014-04-05: qty 1

## 2014-04-05 MED ORDER — CEFAZOLIN SODIUM-DEXTROSE 2-3 GM-% IV SOLR
2.0000 g | Freq: Four times a day (QID) | INTRAVENOUS | Status: AC
  Administered 2014-04-05 (×2): 2 g via INTRAVENOUS
  Filled 2014-04-05 (×2): qty 50

## 2014-04-05 MED ORDER — BUPIVACAINE-EPINEPHRINE (PF) 0.5% -1:200000 IJ SOLN
INTRAMUSCULAR | Status: DC | PRN
  Administered 2014-04-05: 30 mL via PERINEURAL

## 2014-04-05 MED ORDER — ZOLPIDEM TARTRATE 5 MG PO TABS
5.0000 mg | ORAL_TABLET | Freq: Every evening | ORAL | Status: DC | PRN
Start: 2014-04-05 — End: 2014-04-08

## 2014-04-05 MED ORDER — FENTANYL CITRATE 0.05 MG/ML IJ SOLN
INTRAMUSCULAR | Status: AC
  Filled 2014-04-05: qty 5

## 2014-04-05 MED ORDER — ENOXAPARIN SODIUM 30 MG/0.3ML ~~LOC~~ SOLN
30.0000 mg | Freq: Two times a day (BID) | SUBCUTANEOUS | Status: DC
  Administered 2014-04-06 – 2014-04-08 (×5): 30 mg via SUBCUTANEOUS
  Filled 2014-04-05 (×7): qty 0.3

## 2014-04-05 MED ORDER — ONDANSETRON HCL 4 MG/2ML IJ SOLN: 4.0000 mg | Freq: Once | INTRAMUSCULAR | Status: DC | PRN

## 2014-04-05 MED ORDER — FENTANYL CITRATE 0.05 MG/ML IJ SOLN
50.0000 ug | Freq: Once | INTRAMUSCULAR | Status: AC
  Administered 2014-04-05: 50 ug via INTRAVENOUS

## 2014-04-05 MED ORDER — HYDROMORPHONE HCL PF 1 MG/ML IJ SOLN
1.0000 mg | INTRAMUSCULAR | Status: DC | PRN
  Administered 2014-04-05: 1 mg via INTRAVENOUS
  Filled 2014-04-05: qty 1

## 2014-04-05 MED ORDER — METOCLOPRAMIDE HCL 5 MG PO TABS
5.0000 mg | ORAL_TABLET | Freq: Three times a day (TID) | ORAL | Status: DC | PRN
  Filled 2014-04-05: qty 2

## 2014-04-05 MED ORDER — CHLORTHALIDONE 25 MG PO TABS
25.0000 mg | ORAL_TABLET | Freq: Every day | ORAL | Status: DC
  Administered 2014-04-06 – 2014-04-08 (×3): 25 mg via ORAL
  Filled 2014-04-05 (×3): qty 1

## 2014-04-05 MED ORDER — IBUPROFEN 800 MG PO TABS
800.0000 mg | ORAL_TABLET | Freq: Three times a day (TID) | ORAL | Status: DC | PRN
  Filled 2014-04-05: qty 1

## 2014-04-05 MED ORDER — SODIUM CHLORIDE 0.9 % IV SOLN: INTRAVENOUS | Status: DC

## 2014-04-05 MED ORDER — SIMVASTATIN 20 MG PO TABS
20.0000 mg | ORAL_TABLET | Freq: Every day | ORAL | Status: DC
  Administered 2014-04-05 – 2014-04-06 (×2): 20 mg via ORAL
  Filled 2014-04-05 (×4): qty 1

## 2014-04-05 MED ORDER — BUPIVACAINE-EPINEPHRINE 0.5% -1:200000 IJ SOLN
INTRAMUSCULAR | Status: DC | PRN
  Administered 2014-04-05: 30 mL

## 2014-04-05 MED ORDER — ONDANSETRON HCL 4 MG/2ML IJ SOLN: 4.0000 mg | Freq: Four times a day (QID) | INTRAMUSCULAR | Status: DC | PRN

## 2014-04-05 MED ORDER — ROCURONIUM BROMIDE 50 MG/5ML IV SOLN
INTRAVENOUS | Status: AC
  Filled 2014-04-05: qty 1

## 2014-04-05 MED ORDER — OXYCODONE HCL 5 MG/5ML PO SOLN: 5.0000 mg | Freq: Once | ORAL | Status: DC | PRN

## 2014-04-05 MED ORDER — SENNOSIDES-DOCUSATE SODIUM 8.6-50 MG PO TABS: 1.0000 | ORAL_TABLET | Freq: Every evening | ORAL | Status: DC | PRN

## 2014-04-05 MED ORDER — AMLODIPINE BESYLATE 5 MG PO TABS
5.0000 mg | ORAL_TABLET | Freq: Every day | ORAL | Status: DC
  Administered 2014-04-05 – 2014-04-07 (×3): 5 mg via ORAL
  Filled 2014-04-05 (×4): qty 1

## 2014-04-05 MED ORDER — HYDROMORPHONE HCL PF 1 MG/ML IJ SOLN
0.2500 mg | INTRAMUSCULAR | Status: DC | PRN
  Administered 2014-04-05: 0.25 mg via INTRAVENOUS
  Administered 2014-04-05: 0.5 mg via INTRAVENOUS
  Administered 2014-04-05: 0.25 mg via INTRAVENOUS

## 2014-04-05 MED ORDER — PHENYLEPHRINE 40 MCG/ML (10ML) SYRINGE FOR IV PUSH (FOR BLOOD PRESSURE SUPPORT)
PREFILLED_SYRINGE | INTRAVENOUS | Status: AC
  Filled 2014-04-05: qty 10

## 2014-04-05 MED ORDER — PROPOFOL 10 MG/ML IV BOLUS
INTRAVENOUS | Status: DC | PRN
  Administered 2014-04-05: 200 mg via INTRAVENOUS

## 2014-04-05 MED ORDER — ONDANSETRON HCL 4 MG/2ML IJ SOLN
INTRAMUSCULAR | Status: AC
  Filled 2014-04-05: qty 4

## 2014-04-05 MED ORDER — ACETAMINOPHEN 325 MG PO TABS
650.0000 mg | ORAL_TABLET | Freq: Four times a day (QID) | ORAL | Status: DC | PRN
  Administered 2014-04-07 – 2014-04-08 (×2): 650 mg via ORAL
  Filled 2014-04-05 (×2): qty 2

## 2014-04-05 MED ORDER — MIDAZOLAM HCL 2 MG/2ML IJ SOLN
INTRAMUSCULAR | Status: AC
  Administered 2014-04-05: 2 mg
  Filled 2014-04-05: qty 2

## 2014-04-05 MED ORDER — LIDOCAINE HCL (CARDIAC) 20 MG/ML IV SOLN
INTRAVENOUS | Status: AC
  Filled 2014-04-05: qty 5

## 2014-04-05 MED ORDER — HYDROMORPHONE HCL PF 1 MG/ML IJ SOLN
INTRAMUSCULAR | Status: AC
  Administered 2014-04-05: 0.25 mg via INTRAVENOUS
  Filled 2014-04-05: qty 1

## 2014-04-05 MED ORDER — MIDAZOLAM HCL 2 MG/2ML IJ SOLN
INTRAMUSCULAR | Status: AC
  Filled 2014-04-05: qty 2

## 2014-04-05 MED ORDER — CEFAZOLIN SODIUM-DEXTROSE 2-3 GM-% IV SOLR
2.0000 g | Freq: Once | INTRAVENOUS | Status: AC
  Administered 2014-04-05: 2 g via INTRAVENOUS

## 2014-04-05 MED ORDER — ALUM & MAG HYDROXIDE-SIMETH 200-200-20 MG/5ML PO SUSP: 30.0000 mL | ORAL | Status: DC | PRN

## 2014-04-05 SURGICAL SUPPLY — 56 items
BANDAGE ESMARK 6X9 LF (GAUZE/BANDAGES/DRESSINGS) ×1 IMPLANT
BLADE SAGITTAL 13X1.27X60 (BLADE) ×2 IMPLANT
BLADE SAW SGTL 83.5X18.5 (BLADE) ×2 IMPLANT
BNDG ESMARK 6X9 LF (GAUZE/BANDAGES/DRESSINGS) ×2
BOWL SMART MIX CTS (DISPOSABLE) ×2 IMPLANT
CAP POR NKTM CP VIT E LN CER ×2 IMPLANT
CEMENT BONE SIMPLEX SPEEDSET (Cement) ×4 IMPLANT
COVER SURGICAL LIGHT HANDLE (MISCELLANEOUS) ×2 IMPLANT
CUFF TOURNIQUET SINGLE 34IN LL (TOURNIQUET CUFF) ×2 IMPLANT
DRAPE EXTREMITY T 121X128X90 (DRAPE) ×2 IMPLANT
DRAPE INCISE IOBAN 66X45 STRL (DRAPES) ×4 IMPLANT
DRAPE PROXIMA HALF (DRAPES) ×2 IMPLANT
DRAPE U-SHAPE 47X51 STRL (DRAPES) ×2 IMPLANT
DRSG ADAPTIC 3X8 NADH LF (GAUZE/BANDAGES/DRESSINGS) ×2 IMPLANT
DRSG PAD ABDOMINAL 8X10 ST (GAUZE/BANDAGES/DRESSINGS) ×2 IMPLANT
DURAPREP 26ML APPLICATOR (WOUND CARE) ×2 IMPLANT
ELECT REM PT RETURN 9FT ADLT (ELECTROSURGICAL) ×2
ELECTRODE REM PT RTRN 9FT ADLT (ELECTROSURGICAL) ×1 IMPLANT
EVACUATOR 1/8 PVC DRAIN (DRAIN) ×2 IMPLANT
GAUZE SPONGE 4X4 12PLY STRL (GAUZE/BANDAGES/DRESSINGS) ×2 IMPLANT
GLOVE BIOGEL M 7.0 STRL (GLOVE) IMPLANT
GLOVE BIOGEL PI IND STRL 7.5 (GLOVE) IMPLANT
GLOVE BIOGEL PI IND STRL 8.5 (GLOVE) ×2 IMPLANT
GLOVE BIOGEL PI INDICATOR 7.5 (GLOVE)
GLOVE BIOGEL PI INDICATOR 8.5 (GLOVE) ×2
GLOVE SURG ORTHO 8.0 STRL STRW (GLOVE) ×4 IMPLANT
GOWN STRL REUS W/ TWL LRG LVL3 (GOWN DISPOSABLE) ×2 IMPLANT
GOWN STRL REUS W/ TWL XL LVL3 (GOWN DISPOSABLE) ×2 IMPLANT
GOWN STRL REUS W/TWL LRG LVL3 (GOWN DISPOSABLE) ×2
GOWN STRL REUS W/TWL XL LVL3 (GOWN DISPOSABLE) ×2
HANDPIECE INTERPULSE COAX TIP (DISPOSABLE) ×1
HOOD PEEL AWAY FACE SHEILD DIS (HOOD) ×6 IMPLANT
KIT BASIN OR (CUSTOM PROCEDURE TRAY) ×2 IMPLANT
KIT ROOM TURNOVER OR (KITS) ×2 IMPLANT
MANIFOLD NEPTUNE II (INSTRUMENTS) ×2 IMPLANT
NEEDLE 22X1 1/2 (OR ONLY) (NEEDLE) ×4 IMPLANT
NS IRRIG 1000ML POUR BTL (IV SOLUTION) ×2 IMPLANT
PACK TOTAL JOINT (CUSTOM PROCEDURE TRAY) ×2 IMPLANT
PAD ABD 8X10 STRL (GAUZE/BANDAGES/DRESSINGS) ×2 IMPLANT
PAD ARMBOARD 7.5X6 YLW CONV (MISCELLANEOUS) ×4 IMPLANT
PADDING CAST COTTON 6X4 STRL (CAST SUPPLIES) ×2 IMPLANT
SET HNDPC FAN SPRY TIP SCT (DISPOSABLE) ×1 IMPLANT
SPONGE GAUZE 4X4 12PLY STER LF (GAUZE/BANDAGES/DRESSINGS) ×2 IMPLANT
STAPLER VISISTAT 35W (STAPLE) ×2 IMPLANT
SUCTION FRAZIER TIP 10 FR DISP (SUCTIONS) ×2 IMPLANT
SUT BONE WAX W31G (SUTURE) ×2 IMPLANT
SUT VIC AB 0 CTB1 27 (SUTURE) ×4 IMPLANT
SUT VIC AB 1 CT1 27 (SUTURE) ×3
SUT VIC AB 1 CT1 27XBRD ANBCTR (SUTURE) ×3 IMPLANT
SUT VIC AB 2-0 CT1 27 (SUTURE) ×2
SUT VIC AB 2-0 CT1 TAPERPNT 27 (SUTURE) ×2 IMPLANT
SYR 20CC LL (SYRINGE) ×4 IMPLANT
TOWEL OR 17X24 6PK STRL BLUE (TOWEL DISPOSABLE) ×2 IMPLANT
TOWEL OR 17X26 10 PK STRL BLUE (TOWEL DISPOSABLE) ×2 IMPLANT
TRAY FOLEY CATH 14FR (SET/KITS/TRAYS/PACK) ×2 IMPLANT
WATER STERILE IRR 1000ML POUR (IV SOLUTION) ×4 IMPLANT

## 2014-04-05 NOTE — Evaluation (Signed)
Physical Therapy Evaluation Patient Details Name: Savannah Reese MRN: [id number] DOB: 1956/05/25 Today's Date: 04/05/2014   History of Present Illness  58 y.o. female s/p right total knee arthroplasty. Hx of HTN.  Clinical Impression  Pt is s/p right TKA presenting with the deficits listed below (see PT Problem List). Tolerated gait and transfer training in room today with moderate knee buckling requiring min assist to stabilize, post op day #0. Pt will benefit from skilled PT to increase their independence and safety with mobility to allow discharge to the venue listed below.      Follow Up Recommendations Home health PT;Supervision for mobility/OOB    Equipment Recommendations  Rolling walker with 5" wheels;3in1 (PT)    Recommendations for Other Services OT consult     Precautions / Restrictions Precautions Precautions: Knee Precaution Comments: Reviewed knee precautions Restrictions Weight Bearing Restrictions: Yes RLE Weight Bearing: Weight bearing as tolerated      Mobility  Bed Mobility Overal bed mobility: Needs Assistance Bed Mobility: Supine to Sit     Supine to sit: Min guard     General bed mobility comments: Min guard for safety. VC for technique.  Transfers Overall transfer level: Needs assistance Equipment used: Rolling walker (2 wheeled) Transfers: Sit to/from Stand Sit to Stand: Min assist         General transfer comment: Min assist for boost from lowest bed setting. Verbal and tactile cues for hand placement. Requires extra time.  Ambulation/Gait Ambulation/Gait assistance: Min assist Ambulation Distance (Feet): 10 Feet Assistive device: Rolling walker (2 wheeled) Gait Pattern/deviations: Step-to pattern;Decreased step length - left;Decreased stance time - right;Antalgic   Gait velocity interpretation: Below normal speed for age/gender General Gait Details: Educated on safe DME use and sequencing of gait. Tactile cues for quad activation in  right stance phase. Moderate instability with buckling noted but shows good UE strength. Min assist to block RLE from buckling at knee.  Stairs            Wheelchair Mobility    Modified Rankin (Stroke Patients Only)       Balance Overall balance assessment: Needs assistance Sitting-balance support: Feet supported;No upper extremity supported Sitting balance-Leahy Scale: Good     Standing balance support: Single extremity supported Standing balance-Leahy Scale: Poor                               Pertinent Vitals/Pain Pt reports pain as "low" per daughter's translation but would like pain medication Nurse notified Patient repositioned in chair for comfort.     Home Living Family/patient expects to be discharged to:: Private residence Living Arrangements: Other relatives (Cousin) Available Help at Discharge: Family;Available 24 hours/day Type of Home: Apartment Home Access: Level entry     Home Layout: One level Home Equipment: None      Prior Function Level of Independence: Independent with assistive device(s)         Comments: Ambulates with SPC. Could bath/dress herself.     Hand Dominance        Extremity/Trunk Assessment   Upper Extremity Assessment: Defer to OT evaluation           Lower Extremity Assessment: RLE deficits/detail RLE Deficits / Details: decreased strength and ROM as expected post-op       Communication   Communication: Prefers language other than English ([place] - daughter speaks english)  Cognition Arousal/Alertness: Awake/alert Behavior During Therapy: WFL for tasks assessed/performed Overall Cognitive  Status: Difficult to assess (Speaks language other than english)                      General Comments      Exercises Total Joint Exercises Ankle Circles/Pumps: AROM;Both;10 reps;Supine Quad Sets: AROM;Right;10 reps;Supine      Assessment/Plan    PT Assessment Patient needs continued PT  services  PT Diagnosis Difficulty walking;Abnormality of gait;Acute pain   PT Problem List Decreased strength;Decreased range of motion;Decreased activity tolerance;Decreased balance;Decreased mobility;Decreased knowledge of use of DME;Decreased knowledge of precautions;Pain  PT Treatment Interventions DME instruction;Gait training;Functional mobility training;Therapeutic activities;Therapeutic exercise;Balance training;Neuromuscular re-education;Patient/family education;Modalities   PT Goals (Current goals can be found in the Care Plan section) Acute Rehab PT Goals Patient Stated Goal: No pain PT Goal Formulation: With patient Time For Goal Achievement: 04/12/14 Potential to Achieve Goals: Good    Frequency 7X/week   Barriers to discharge        Co-evaluation               End of Session   Activity Tolerance: Patient tolerated treatment well Patient left: in chair;with call bell/phone within reach;with family/visitor present;with nursing/sitter in room Nurse Communication: Mobility status;Precautions;Other (comment) (stand pivot transfer back to bed- Rt knee instability)         Time: [id number] PT Time Calculation (min): 28 min   Charges:   PT Evaluation $Initial PT Evaluation Tier I: 1 Procedure PT Treatments $Gait Training: 8-22 mins   PT G Codes:        Charlsie MerlesLogan Secor Amiaya Mcneeley, [place]PT [phone]   Berton Mount[place]bour, Kaloni Bisaillon S 04/05/2014, 5:25 PM

## 2014-04-05 NOTE — Anesthesia Postprocedure Evaluation (Signed)
Anesthesia Post Note  Patient: Savannah Reese  Procedure(s) Performed: Procedure(s) (LRB): TOTAL KNEE ARTHROPLASTY (Right)  Anesthesia type: general  Patient location: PACU  Post pain: Pain level controlled  Post assessment: Patient's Cardiovascular Status Stable  Last Vitals:  Filed Vitals:   04/05/14 1215  BP: 136/84  Pulse: 74  Temp:   Resp: 9    Post vital signs: Reviewed and stable  Level of consciousness: sedated  Complications: No apparent anesthesia complications

## 2014-04-05 NOTE — H&P (Signed)
°  Savannah Reese MRN:  [id number] DOB/SEX:  10/13/1955/female  CHIEF COMPLAINT:  Painful right Knee  HISTORY: Patient is a 58 y.o. female presented with a history of pain in the right knee. Onset of symptoms was gradual starting several years ago with gradually worsening course since that time. Prior procedures on the knee include none. Patient has been treated conservatively with over-the-counter NSAIDs and activity modification. Patient currently rates pain in the knee at 10 out of 10 with activity. There is pain at night.  PAST MEDICAL HISTORY: There are no active problems to display for this patient.  Past Medical History  Diagnosis Date   Hypertension    Arthritis    Past Surgical History  Procedure Laterality Date   No past surgeries       MEDICATIONS:   Prescriptions prior to admission  Medication Sig Dispense Refill   amLODipine (NORVASC) 5 MG tablet Take 5 mg by mouth at bedtime.       atenolol-chlorthalidone (TENORETIC) 50-25 MG per tablet Take 1 tablet by mouth daily.       ibuprofen (ADVIL,MOTRIN) 800 MG tablet Take 800 mg by mouth 3 (three) times daily as needed for mild pain.       lisinopril (PRINIVIL,ZESTRIL) 20 MG tablet Take 20 mg by mouth daily.       pravastatin (PRAVACHOL) 40 MG tablet Take 40 mg by mouth at bedtime.        ALLERGIES:  Not on File  REVIEW OF SYSTEMS:  A comprehensive review of systems was negative.   FAMILY HISTORY:  No family history on file.  SOCIAL HISTORY:   History  Substance Use Topics   Smoking status: Never Smoker    Smokeless tobacco: Not on file   Alcohol Use: No     EXAMINATION:  Vital signs in last 24 hours:    General appearance: alert, cooperative and no distress Lungs: clear to auscultation bilaterally Heart: regular rate and rhythm, S1, S2 normal, no murmur, click, rub or gallop Abdomen: soft, non-tender; bowel sounds normal; no masses,  no organomegaly Extremities: extremities normal, atraumatic, no  cyanosis or edema and Homans sign is negative, no sign of DVT Pulses: 2+ and symmetric Skin: Skin color, texture, turgor normal. No rashes or lesions Neurologic: Alert and oriented X 3, normal strength and tone. Normal symmetric reflexes. Normal coordination and gait  Musculoskeletal:  ROM 0-100, Ligaments intact,  Imaging Review Plain radiographs demonstrate severe degenerative joint disease of the right knee. The overall alignment is significant valgus. The bone quality appears to be good for age and reported activity level.  Assessment/Plan: End stage arthritis, right knee   The patient history, physical examination and imaging studies are consistent with advanced degenerative joint disease of the right knee. The patient has failed conservative treatment.  The clearance notes were reviewed.  After discussion with the patient it was felt that Total Knee Replacement was indicated. The procedure,  risks, and benefits of total knee arthroplasty were presented and reviewed. The risks including but not limited to aseptic loosening, infection, blood clots, vascular injury, stiffness, patella tracking problems complications among others were discussed. The patient acknowledged the explanation, agreed to proceed with the plan.  Savannah Reese 04/05/2014, 6:46 AM

## 2014-04-05 NOTE — Transfer of Care (Signed)
Immediate Anesthesia Transfer of Care Note  Patient: Savannah Reese  Procedure(s) Performed: Procedure(s): TOTAL KNEE ARTHROPLASTY (Right)  Patient Location: PACU  Anesthesia Type:General  Level of Consciousness: awake, alert  and oriented  Airway & Oxygen Therapy: Patient Spontanous Breathing and Patient connected to nasal cannula oxygen  Post-op Assessment: Report given to PACU RN and Post -op Vital signs reviewed and stable  Post vital signs: Reviewed and stable  Complications: No apparent anesthesia complications

## 2014-04-05 NOTE — Progress Notes (Signed)
Orthopedic Tech Progress Note Patient Details:  Savannah Reese 07/26/1956 [id number]  CPM Right Knee CPM Right Knee: On Right Knee Flexion (Degrees): 90 Right Knee Extension (Degrees): 0 Additional Comments: Trapeze bar and  foot roll   Cammer, Mickie BailJennifer Carol 04/05/2014, 11:59 AM

## 2014-04-05 NOTE — Progress Notes (Signed)
Orthopedic Tech Progress Note Patient Details:  Savannah Reese 05/12/1956 [id number] On cpm at 8:00 pm RLE 0-90 Patient ID: Savannah Reese, female   DOB: 08/10/1956, 58 y.o.   MRN: [id number]   Jennye MoccasinHughes, Arlee Bossard Craig 04/05/2014, 8:04 PM

## 2014-04-05 NOTE — Op Note (Signed)
TOTAL KNEE REPLACEMENT OPERATIVE NOTE:  04/05/2014  4:37 PM  PATIENT:  Savannah Reese  58 y.o. female  PRE-OPERATIVE DIAGNOSIS:  osteoarthritis right knee  POST-OPERATIVE DIAGNOSIS:  osteoarthritis right knee  PROCEDURE:  Procedure(s): TOTAL KNEE ARTHROPLASTY  SURGEON:  Surgeon(s): Dannielle Huh, MD  PHYSICIAN ASSISTANT: Altamese Cabal, [facility]  ANESTHESIA:   general  DRAINS: Hemovac  SPECIMEN: None  COUNTS:  Correct  TOURNIQUET:   Total Tourniquet Time Documented: Thigh (Right) - 64 minutes Total: Thigh (Right) - 64 minutes   DICTATION:  Indication for procedure:    The patient is a 58 y.o. female who has failed conservative treatment for osteoarthritis right knee.  Informed consent was obtained prior to anesthesia. The risks versus benefits of the operation were explain and in a way the patient can, and did, understand.   On the implant demand matching protocol, this patient scored 10.  Therefore, this patient was not receive a polyethylene insert with vitamin E which is a high demand implant.  Description of procedure:     The patient was taken to the operating room and placed under anesthesia.  The patient was positioned in the usual fashion taking care that all body parts were adequately padded and/or protected.  I foley catheter was not placed.  A tourniquet was applied and the leg prepped and draped in the usual sterile fashion.  The extremity was exsanguinated with the esmarch and tourniquet inflated to 350 mmHg.  Pre-operative range of motion was normal.  The knee was in 5 degree of mild varus.  A midline incision approximately 6-7 inches long was made with a #10 blade.  A new blade was used to make a parapatellar arthrotomy going 2-3 cm into the quadriceps tendon, over the patella, and alongside the medial aspect of the patellar tendon.  A synovectomy was then performed with the #10 blade and forceps. I then elevated the deep MCL off the medial tibial metaphysis  subperiosteally around to the semimembranosus attachment.    I everted the patella and used calipers to measure patellar thickness.  I used the reamer to ream down to appropriate thickness to recreate the native thickness.  I then removed excess bone with the rongeur and sagittal saw.  I used the appropriately sized template and drilled the three lug holes.  I then put the trial in place and measured the thickness with the calipers to ensure recreation of the native thickness.  The trial was then removed and the patella subluxed and the knee brought into flexion.  A homan retractor was place to retract and protect the patella and lateral structures.  A Z-retractor was place medially to protect the medial structures.  The extra-medullary alignment system was used to make cut the tibial articular surface perpendicular to the anamotic axis of the tibia and in 3 degrees of posterior slope.  The cut surface and alignment jig was removed.  I then used the intramedullary alignment guide to make a 6 valgus cut on the distal femur.  I then marked out the epicondylar axis on the distal femur.  The posterior condylar axis measured 3 degrees.  I then used the anterior referencing sizer and measured the femur to be a size 8.  The 4-In-1 cutting block was screwed into place in external rotation matching the posterior condylar angle, making our cuts perpendicular to the epicondylar axis.  Anterior, posterior and chamfer cuts were made with the sagittal saw.  The cutting block and cut pieces were removed.  A lamina  spreader was placed in 90 degrees of flexion.  The ACL, PCL, menisci, and posterior condylar osteophytes were removed.  A 13 mm spacer blocked was found to offer good flexion and extension gap balance after minimal in degree releasing.   The scoop retractor was then placed and the femoral finishing block was pinned in place.  The small sagittal saw was used as well as the lug drill to finish the femur.  The block  and cut surfaces were removed and the medullary canal hole filled with autograft bone from the cut pieces.  The tibia was delivered forward in deep flexion and external rotation.  A size D tray was selected and pinned into place centered on the medial 1/3 of the tibial tubercle.  The reamer and keel was used to prepare the tibia through the tray.    I then trialed with the size 8 femur, size D tibia, a 13 mm insert and the 32 patella.  I had excellent flexion/extension gap balance, excellent patella tracking.  Flexion was full and beyond 120 degrees; extension was zero.  These components were chosen and the staff opened them to me on the back table while the knee was lavaged copiously and the cement mixed.  The soft tissue was infiltrated with 60cc of exparel 1.3% through a 21 gauge needle.  I cemented in the components and removed all excess cement.  The polyethylene tibial component was snapped into place and the knee placed in extension while cement was hardening.  The capsule was infilltrated with 30cc of .25% Marcaine with epinephrine.  A hemovac was place in the joint exiting superolaterally.  A pain pump was place superomedially superficial to the arthrotomy.  Once the cement was hard, the tourniquet was let down.  Hemostasis was obtained.  The arthrotomy was closed with figure-8 #1 vicryl sutures.  The deep soft tissues were closed with #0 vicryls and the subcuticular layer closed with a running #2-0 vicryl.  The skin was reapproximated and closed with skin staples.  The wound was dressed with xeroform, 4 x4's, 2 ABD sponges, a single layer of webril and a TED stocking.   The patient was then awakened, extubated, and taken to the recovery room in stable condition.  BLOOD LOSS:  300cc DRAINS: 1 hemovac, 1 pain catheter COMPLICATIONS:  None.  PLAN OF CARE: Admit to inpatient   PATIENT DISPOSITION:  PACU - hemodynamically stable.   Delay start of Pharmacological VTE agent (>24hrs) due to  surgical blood loss or risk of bleeding:  not applicable  Please fax a copy of this op note to my office at [phone] (please only include page 1 and 2 of the Case Information op note)

## 2014-04-05 NOTE — Progress Notes (Signed)
Utilization review complete

## 2014-04-05 NOTE — Anesthesia Preprocedure Evaluation (Addendum)
Anesthesia Evaluation  Patient identified by MRN, date of birth, ID band Patient awake    Reviewed: Allergy & Precautions, H&P , NPO status , Patient's Chart, lab work & pertinent test results  Airway Mallampati: II TM Distance: >3 FB Neck ROM: Full    Dental  (+) Teeth Intact, Poor Dentition, Dental Advisory Given   Pulmonary          Cardiovascular hypertension, Pt. on medications     Neuro/Psych    GI/Hepatic   Endo/Other    Renal/GU      Musculoskeletal  (+) Arthritis -,   Abdominal   Peds  Hematology   Anesthesia Other Findings   Reproductive/Obstetrics                          Anesthesia Physical Anesthesia Plan  ASA: II  Anesthesia Plan: General   Post-op Pain Management: MAC Combined w/ Regional for Post-op pain   Induction: Intravenous  Airway Management Planned: LMA and Oral ETT  Additional Equipment:   Intra-op Plan:   Post-operative Plan: Extubation in OR  Informed Consent: I have reviewed the patients History and Physical, chart, labs and discussed the procedure including the risks, benefits and alternatives for the proposed anesthesia with the patient or authorized representative who has indicated his/her understanding and acceptance.   Dental advisory given  Plan Discussed with: Anesthesiologist and Surgeon  Anesthesia Plan Comments:        Anesthesia Quick Evaluation

## 2014-04-05 NOTE — Anesthesia Procedure Notes (Addendum)
Anesthesia Regional Block:  Adductor canal block  Pre-Anesthetic Checklist: ,, timeout performed, Correct Patient, Correct Site, Correct Laterality, Correct Procedure, Correct Position, site marked, Risks and benefits discussed,  Surgical consent,  Pre-op evaluation,  At surgeon's request and post-op pain management  Laterality: Right  Prep: chloraprep       Needles:  Injection technique: Single-shot  Needle Type: Echogenic Stimulator Needle     Needle Length: 9cm 9 cm Needle Gauge: 21 and 21 G    Additional Needles:  Procedures: ultrasound guided (picture in chart) Adductor canal block Narrative:  Start time: 04/05/2014 8:10 AM End time: 04/05/2014 8:25 AM Injection made incrementally with aspirations every 5 mL.  Performed by: Personally  Anesthesiologist: Arta BruceKevin Ossey MD  Additional Notes: Monitors applied. Patient sedated. Sterile prep and drape,hand hygiene and sterile gloves were used. Relevant anatomy identified.Needle position confirmed.Local anesthetic injected incrementally after negative aspiration. Local anesthetic spread visualized around nerve(s). Vascular puncture avoided. No complications. Image printed for medical record.The patient tolerated the procedure well.    Arta BruceKevin Ossey MD   Procedure Name: LMA Insertion Date/Time: 04/05/2014 8:47 AM Performed by: Leonel Ramsay'LAUGHLIN, Glorian Mcdonell H Pre-anesthesia Checklist: Patient identified, Timeout performed, Emergency Drugs available, Suction available and Patient being monitored Patient Re-evaluated:Patient Re-evaluated prior to inductionOxygen Delivery Method: Circle system utilized Preoxygenation: Pre-oxygenation with 100% oxygen Intubation Type: IV induction LMA: LMA inserted LMA Size: 4.0 Number of attempts: 1 Placement Confirmation: positive ETCO2 and breath sounds checked- equal and bilateral Tube secured with: Tape Dental Injury: Teeth and Oropharynx as per pre-operative assessment

## 2014-04-06 LAB — BASIC METABOLIC PANEL
Anion gap: 16 — ABNORMAL HIGH (ref 5–15)
BUN: 20 mg/dL (ref 6–23)
CO2: 21 meq/L (ref 19–32)
Calcium: 8.7 mg/dL (ref 8.4–10.5)
Chloride: 103 mEq/L (ref 96–112)
Creatinine, Ser: 0.91 mg/dL (ref 0.50–1.10)
GFR calc Af Amer: 78 mL/min — ABNORMAL LOW (ref >90)
GFR calc non Af Amer: 67 mL/min — ABNORMAL LOW (ref >90)
Glucose, Bld: 131 mg/dL — ABNORMAL HIGH (ref 70–99)
POTASSIUM: 3.6 meq/L — AB (ref 3.7–5.3)
SODIUM: 140 meq/L (ref 137–147)

## 2014-04-06 LAB — CBC
HEMATOCRIT: 34.1 % — AB (ref 36.0–46.0)
HEMOGLOBIN: 11 g/dL — AB (ref 12.0–15.0)
MCH: 27.8 pg (ref 26.0–34.0)
MCHC: 32.3 g/dL (ref 30.0–36.0)
MCV: 86.1 fL (ref 78.0–100.0)
Platelets: 246 10*3/uL (ref 150–400)
RBC: 3.96 MIL/uL (ref 3.87–5.11)
RDW: 14 % (ref 11.5–15.5)
WBC: 12.8 10*3/uL — AB (ref 4.0–10.5)

## 2014-04-06 NOTE — Progress Notes (Signed)
Physical Therapy Treatment Patient Details Name: Savannah Reese MRN: [id number] DOB: 12/14/1955 Today's Date: 04/06/2014    History of Present Illness 58 y.o. female s/p right total knee arthroplasty. Hx of HTN.    PT Comments    Patient is progressing well towards physical therapy goals, ambulating up to 30 feet with min guard assist and use of a rolling walker. Mild instability of right knee but able to prevent buckling with use of UEs on walker. Tolerating therapeutic exercises well. Patient will continue to benefit from skilled physical therapy services to further improve independence with functional mobility.    Follow Up Recommendations  Home health PT;Supervision for mobility/OOB     Equipment Recommendations  Rolling walker with 5" wheels;3in1 (PT)    Recommendations for Other Services OT consult     Precautions / Restrictions Precautions Precautions: Knee Precaution Comments: Reviewed knee precautions Restrictions Weight Bearing Restrictions: Yes RLE Weight Bearing: Weight bearing as tolerated    Mobility  Bed Mobility Overal bed mobility: Needs Assistance Bed Mobility: Supine to Sit     Supine to sit: Supervision     General bed mobility comments: Supervision for safety with cues for technique. no physical assist needed. Requires extra time.  Transfers Overall transfer level: Needs assistance Equipment used: Rolling walker (2 wheeled) Transfers: Sit to/from Stand Sit to Stand: Min guard         General transfer comment: Min guard for safety. VC for hand placement and upright posture. Leans heavily over walker and requires extra time. No physical assist required from lowest bed setting.  Ambulation/Gait Ambulation/Gait assistance: Min guard Ambulation Distance (Feet): 30 Feet Assistive device: Rolling walker (2 wheeled) Gait Pattern/deviations: Step-to pattern;Decreased step length - left;Decreased stance time - right;Antalgic   Gait velocity  interpretation: Below normal speed for age/gender General Gait Details: Instructions for sequencing of gait. VC for right knee extension in stance phase (via interpreter).  Intermittent cues for upright posture with forward gaze. Continues to demonstrate minor instability of Rt knee but no instances of buckling today.   Stairs            Wheelchair Mobility    Modified Rankin (Stroke Patients Only)       Balance Overall balance assessment: Needs assistance Sitting-balance support: Feet supported;No upper extremity supported Sitting balance-Leahy Scale: Good                              Cognition Arousal/Alertness: Awake/alert Behavior During Therapy: WFL for tasks assessed/performed Overall Cognitive Status: Difficult to assess                      Exercises Total Joint Exercises Ankle Circles/Pumps: AROM;Both;10 reps;Supine Quad Sets: AROM;Right;10 reps;Supine Knee Flexion: AAROM;Right;10 reps;Seated Goniometric ROM: 8-75 degrees knee flexion in sitting.    General Comments        Pertinent Vitals/Pain Pt reports via interpreter that nurse has recently brought pain medication prior to PT session. Reports pain is increased from yesterday, but no numerical value given. Patient repositioned in chair for comfort.     Home Living Family/patient expects to be discharged to:: Private residence Living Arrangements: Other relatives Available Help at Discharge: Family;Available 24 hours/day Type of Home: Apartment Home Access: Level entry   Home Layout: One level Home Equipment: Adaptive equipment      Prior Function Level of Independence: Independent with assistive device(s)      Comments: Ambulates with SPC. Could  bath/dress herself.   PT Goals (current goals can now be found in the care plan section) Acute Rehab PT Goals Patient Stated Goal: not stated PT Goal Formulation: With patient Time For Goal Achievement: 04/12/14 Potential to  Achieve Goals: Good Progress towards PT goals: Progressing toward goals    Frequency  7X/week    PT Plan Current plan remains appropriate    Co-evaluation             End of Session   Activity Tolerance: Patient tolerated treatment well Patient left: in chair;with call bell/phone within reach;with family/visitor present     Time: 5409-8119 PT Time Calculation (min): 28 min  Charges:  $Gait Training: 8-22 mins $Therapeutic Activity: 8-22 mins                    G Codes:      [employer], [place] [phone]  Savannah Reese 04/06/2014, 1:23 PM

## 2014-04-06 NOTE — Evaluation (Signed)
Occupational Therapy Evaluation Patient Details Name: Savannah Reese MRN: [id number] DOB: 01/07/1956 Today's Date: 04/06/2014    History of Present Illness 58 y.o. female s/p right total knee arthroplasty. Hx of HTN.   Clinical Impression   Pt admitted with the above diagnoses and presents with below problem list. Pt will benefit from continued acute OT to address the below listed deficits and maximize independence with basic ADLs prior to d/c home with family. PTA pt was mod I with ADLs using a SPC. Pt currently at mod A level for ADLs. Pain was a limiting factor this session.      Follow Up Recommendations  Supervision/Assistance - 24 hour;No OT follow up    Equipment Recommendations  Other (comment) (Pt has recommended DME.)    Recommendations for Other Services       Precautions / Restrictions Precautions Precautions: Knee Restrictions Weight Bearing Restrictions: Yes RLE Weight Bearing: Weight bearing as tolerated      Mobility Bed Mobility Overal bed mobility: Needs Assistance Bed Mobility: Supine to Sit     Supine to sit: Min assist;HOB elevated     General bed mobility comments: min A to support RLE to bring off/on bed   Transfers                 General transfer comment: Pt declined sit <> stand due to 10/10 pain.    Balance Overall balance assessment: Needs assistance Sitting-balance support: Feet supported;No upper extremity supported Sitting balance-Leahy Scale: Good                                      ADL Overall ADL's : Needs assistance/impaired Eating/Feeding: Set up;Sitting   Grooming: Set up;Sitting   Upper Body Bathing: Set up;Sitting   Lower Body Bathing: Moderate assistance;Sit to/from stand   Upper Body Dressing : Set up;Sitting   Lower Body Dressing: Moderate assistance;Sit to/from stand                 General ADL Comments: Education given to pt and family on techniques and AE for safe completion of  ADLs. Pt declined sit<>stand due to 10/10 pain. Sat EOB for 4 minutes     Vision                     Perception     Praxis      Pertinent Vitals/Pain 10/10 pain RLE at start of session. Repositioned from foot roll to rolled blanket under R heel with pt indicating some relief felt. Rest break in this position given before attempting to increase activity to move from supine>EOB. Nursing notified.      Hand Dominance     Extremity/Trunk Assessment Upper Extremity Assessment Upper Extremity Assessment: Overall WFL for tasks assessed   Lower Extremity Assessment Lower Extremity Assessment: Defer to PT evaluation       Communication Communication Communication: Prefers language other than [place];Other (comment) ([place] - family speaks [place])   Cognition Arousal/Alertness: Awake/alert Behavior During Therapy: WFL for tasks assessed/performed Overall Cognitive Status: Difficult to assess                     General Comments       Exercises       Shoulder Instructions      Home Living Family/patient expects to be discharged to:: Private residence Living Arrangements: Other relatives Available Help at Discharge: Family;Available  24 hours/day Type of Home: Apartment Home Access: Level entry     Home Layout: One level     Bathroom Shower/Tub: Chief Strategy Officer: Standard Bathroom Accessibility: Yes How Accessible: Accessible via walker Home Equipment: Adaptive equipment Adaptive Equipment: Reacher        Prior Functioning/Environment Level of Independence: Independent with assistive device(s)        Comments: Ambulates with SPC. Could bath/dress herself.    OT Diagnosis: Acute pain   OT Problem List: Decreased activity tolerance;Impaired balance (sitting and/or standing);Decreased safety awareness;Decreased knowledge of use of DME or AE;Decreased knowledge of precautions;Pain   OT Treatment/Interventions: Self-care/ADL  training;Therapeutic exercise;DME and/or AE instruction;Therapeutic activities;Patient/family education;Balance training    OT Goals(Current goals can be found in the care plan section) Acute Rehab OT Goals Patient Stated Goal: not stated OT Goal Formulation: With patient/family Time For Goal Achievement: 04/13/14 Potential to Achieve Goals: Good ADL Goals Pt Will Perform Lower Body Bathing: with supervision;with adaptive equipment;sit to/from stand Pt Will Perform Lower Body Dressing: with supervision;with adaptive equipment;sit to/from stand Pt Will Transfer to Toilet: with supervision;ambulating;bedside commode Pt Will Perform Toileting - Clothing Manipulation and hygiene: with supervision;sit to/from stand Pt Will Perform Tub/Shower Transfer: with supervision;ambulating;3 in 1;rolling walker  OT Frequency: Min 3X/week   Barriers to D/C:            Co-evaluation              End of Session Equipment Utilized During Treatment: Oxygen Nurse Communication: Other (comment) (Pain level 10/10.)  Activity Tolerance: Patient limited by fatigue;Patient limited by pain Patient left: in bed;with call bell/phone within reach;with family/visitor present   Time: 8413-2440 OT Time Calculation (min): 27 min Charges:  OT General Charges $OT Visit: 1 Procedure OT Evaluation $Initial OT Evaluation Tier I: 1 Procedure OT Treatments $Self Care/Home Management : 8-22 mins G-Codes:    Pilar Grammes 05-06-2014, 10:31 AM

## 2014-04-06 NOTE — Progress Notes (Signed)
PT Cancellation Note  Patient Details Name: Mateo FlowVeuve Niandou MRN: [id number] DOB: 02/02/1956   Cancelled Treatment:    Reason Eval/Treat Not Completed: Pain limiting ability to participate Spoke with patient via interpreter this afternoon. Patient reports that she has been in too much pain lately to work with therapy any further today and would like to continue with therapy tomorrow. Explained importance of being out of bed and working with therapy. Adjusted CPM alignment as it was slightly off center. Will follow up in AM for more physical therapy.  [address] [place], [place] [phone]   [place], Jaynie Hitch S 04/06/2014, 3:33 PM

## 2014-04-06 NOTE — Progress Notes (Signed)
SPORTS MEDICINE AND JOINT REPLACEMENT  Savannah SpurlingStephen Lucey, MD   Savannah CabalMaurice Lissa Rowles, PA-C [address], [place], [place]  [id number]                             [phone]   PROGRESS NOTE  Subjective:  negative for Chest Pain  negative for Shortness of Breath  negative for Nausea/Vomiting   negative for Calf Pain  negative for Bowel Movement   Tolerating Diet: yes         Patient reports pain as 5 on 0-10 scale.    Objective: Vital signs in last 24 hours:   Patient Vitals for the past 24 hrs:  BP Temp Temp src Pulse Resp SpO2 Height Weight  04/06/14 0603 138/69 mmHg 98.4 F (36.9 C) Oral 85 18 98 % - -  04/06/14 0100 147/78 mmHg 98.7 F (37.1 C) Oral 79 18 98 % - -  04/05/14 2130 137/88 mmHg 98.9 F (37.2 C) Oral 91 18 98 % - -  04/05/14 1519 - - - - 16 100 % - -  04/05/14 1257 134/89 mmHg 97.7 F (36.5 C) - 82 14 100 % 5\' 6"  (1.676 m) 97.5 kg (214 lb 15.2 oz)  04/05/14 1215 136/84 mmHg - - 74 9 99 % - -  04/05/14 1200 - - - 74 10 100 % - -  04/05/14 1145 138/82 mmHg - - 78 10 98 % - -  04/05/14 1130 138/82 mmHg 97.2 F (36.2 C) - 79 12 98 % - -  04/05/14 1115 136/70 mmHg - - 77 11 100 % - -  04/05/14 1100 141/80 mmHg - - 83 12 100 % - -  04/05/14 1055 141/80 mmHg 96.8 F (36 C) - - - - - -  04/05/14 0830 126/68 mmHg - - 76 15 100 % - -  04/05/14 0825 122/70 mmHg - - 80 15 100 % - -  04/05/14 0820 110/64 mmHg - - 76 13 100 % - -  04/05/14 0815 144/72 mmHg - - 88 14 100 % - -  04/05/14 0810 135/76 mmHg - - 83 16 100 % - -    @flow {1959:LAST@   Intake/Output from previous day:   08/03 0701 - 08/04 0700 In: 800 [I.V.:800] Out: 1050 [Urine:500; Drains:500]   Intake/Output this shift:       Intake/Output     08/03 0701 - 08/04 0700 08/04 0701 - 08/05 0700   I.V. (mL/kg) 800 (8.2)    Total Intake(mL/kg) 800 (8.2)    Urine (mL/kg/hr) 500 (0.2)    Drains 500 (0.2)    Blood 50 (0)    Total Output 1050     Net -250          Urine Occurrence 6 x        LABORATORY DATA:  Recent Labs  04/05/14 1410 04/06/14 0603  WBC 10.1 12.8*  HGB 12.0 11.0*  HCT 36.8 34.1*  PLT 270 246    Recent Labs  04/05/14 1410 04/06/14 0603  NA  --  140  K  --  3.6*  CL  --  103  CO2  --  21  BUN  --  20  CREATININE 1.05 0.91  GLUCOSE  --  131*  CALCIUM  --  8.7   Lab Results  Component Value Date   INR 0.93 03/25/2014    Examination:  General appearance: alert, cooperative and no distress Extremities:  Homans sign is negative, no sign of DVT  Wound Exam: clean, dry, intact   Drainage:  Scant/small amount Serosanguinous exudate  Motor Exam: EHL and FHL Intact  Sensory Exam: Deep Peroneal normal   Assessment:    1 Day Post-Op  Procedure(s) (LRB): TOTAL KNEE ARTHROPLASTY (Right)  ADDITIONAL DIAGNOSIS:  Active Problems:   S/P total knee replacement using cement  Acute Blood Loss Anemia   Plan: Physical Therapy as ordered Weight Bearing as Tolerated (WBAT)  DVT Prophylaxis:  Lovenox  DISCHARGE PLAN: Home  DISCHARGE NEEDS: HHPT, CPM, Walker and 3-in-1 comode seat         Savannah Reese 04/06/2014, 8:06 AM

## 2014-04-06 NOTE — Care Management Note (Signed)
CARE MANAGEMENT NOTE 04/06/2014  Patient:  Savannah Reese,Savannah Reese   Account Number:  [id number]  Date Initiated:  04/06/2014  Documentation initiated by:  Vance PeperBRADY,Sorren Vallier  Subjective/Objective Assessment:   58 yr old female s/p right total knee arthroplasty.     Action/Plan:   Case manager spoke with patient's daughter concerning home health and DME needs. Patient preoperatively setup with [facility], no changes. Pt. has family support.   Anticipated DC Date:  04/07/2014   Anticipated DC Plan:  HOME W HOME HEALTH SERVICES      DC Planning Services  CM consult      PAC Choice  DURABLE MEDICAL EQUIPMENT  HOME HEALTH   Choice offered to / List presented to:  C-1 Patient   DME arranged  [facility]  CPM  3-N-1      DME agency  [employer]     HH arranged  HH[facility] PT      [facility]   Status of service:  In process, will continue to follow

## 2014-04-07 LAB — CBC
HCT: 28.8 % — ABNORMAL LOW (ref 36.0–46.0)
HEMOGLOBIN: 9.5 g/dL — AB (ref 12.0–15.0)
MCH: 28.6 pg (ref 26.0–34.0)
MCHC: 33 g/dL (ref 30.0–36.0)
MCV: 86.7 fL (ref 78.0–100.0)
Platelets: 218 10*3/uL (ref 150–400)
RBC: 3.32 MIL/uL — ABNORMAL LOW (ref 3.87–5.11)
RDW: 14.2 % (ref 11.5–15.5)
WBC: 12.7 10*3/uL — ABNORMAL HIGH (ref 4.0–10.5)

## 2014-04-07 MED ORDER — OXYCODONE HCL 5 MG PO TABS: 5.0000 mg | ORAL_TABLET | ORAL | Status: DC | PRN

## 2014-04-07 MED ORDER — ASPIRIN EC 325 MG PO TBEC: 325.0000 mg | DELAYED_RELEASE_TABLET | Freq: Every day | ORAL | Status: DC

## 2014-04-07 MED ORDER — METHOCARBAMOL 500 MG PO TABS: 500.0000 mg | ORAL_TABLET | Freq: Four times a day (QID) | ORAL | Status: DC | PRN

## 2014-04-07 MED ORDER — OXYCODONE HCL ER 10 MG PO T12A: 10.0000 mg | EXTENDED_RELEASE_TABLET | Freq: Two times a day (BID) | ORAL | Status: DC

## 2014-04-07 NOTE — Care Management Note (Signed)
CARE MANAGEMENT NOTE 04/07/2014  Patient:  Mateo FlowIANDOU,VEUVE   Account Number:  [id number]  Date Initiated:  04/06/2014  Documentation initiated by:  Vance PeperBRADY,Suhailah Kwan  Subjective/Objective Assessment:   58 yr old female s/p right total knee arthroplasty.     Action/Plan:   Case manager spoke with patient's daughter concerning home health and DME needs. Patient preoperatively setup with [facility], no changes. Pt. has family support.   Anticipated DC Date:  04/07/2014   Anticipated DC Plan:  HOME W HOME HEALTH SERVICES      DC Planning Services  CM consult      PAC Choice  DURABLE MEDICAL EQUIPMENT  HOME HEALTH   Choice offered to / List presented to:  C-1 Patient   DME arranged  [facility]  CPM  3-N-1      DME agency  [employer]     HH arranged  HH-2 PT      [facility]  [facility]   Status of service:  Completed, signed off Medicare Important Message given?   (If response is NO, the following Medicare IM given date fields will be blank) Date Medicare IM given:   Medicare IM given by:   Date Additional Medicare IM given:   Additional Medicare IM given by:    Discharge Disposition:  HOME W HOME HEALTH SERVICES  Per UR Regulation:  Reviewed for med. necessity/level of care/duration of stay

## 2014-04-07 NOTE — Progress Notes (Signed)
Occupational Therapy Treatment and Discharge Patient Details Name: Savannah Reese MRN: [id number] DOB: 05/01/1956 Today's Date: 04/07/2014    History of present illness 58 y.o. female s/p right total knee arthroplasty. Hx of HTN.   OT comments  Focus of session was on practicing a tub transfer to the 3in1 and reviewing LB dressing with pt and family. All education has been completed and pt's family feels comfortable with self care tasks. Pt will have 24/7 supervision at home to assist with ADLs as needed, therefore no further OT is needed. We will sign off.  Follow Up Recommendations  Supervision/Assistance - 24 hour;No OT follow up    Equipment Recommendations   (Pt has all DME needs)       Precautions / Restrictions Precautions Precautions: Knee Precaution Comments: Reviewed knee precautions Restrictions Weight Bearing Restrictions: Yes RLE Weight Bearing: Weight bearing as tolerated       Mobility Bed Mobility               General bed mobility comments: Pt on BSC at the beginning of tx.  Transfers Overall transfer level: Needs assistance Equipment used: Rolling walker (2 wheeled) Transfers: Sit to/from Stand Sit to Stand: Min guard         General transfer comment: VC to keep trunk upright when standing and to prop leg out when sitting/standing for comfort; pt has good hand placement.    Balance Overall balance assessment: Needs assistance Sitting-balance support: Feet supported;No upper extremity supported Sitting balance-Leahy Scale: Good     Standing balance support: Single extremity supported Standing balance-Leahy Scale: Poor                     ADL Overall ADL's : Needs assistance/impaired                         Toilet Transfer: Min guard;Ambulation;RW;BSC   Toileting- ArchitectClothing Manipulation and Hygiene: Sit to/from stand;Moderate assistance   Tub/ Shower Transfer: Tub transfer;Min guard;Ambulation;3 in 1;Rolling walker    Functional mobility during ADLs: Min guard;Rolling walker General ADL Comments: Demonstrated tub transfer with pt and family; given handout on sequence. Family declined AE practicing stating they would help her with her LB dressing/bathing.                 Cognition   Behavior During Therapy: WFL for tasks assessed/performed Overall Cognitive Status: Within Functional Limits for tasks assessed (Pt does not speak English but daughter were present to translate tx.)                                    Pertinent Vitals/ Pain       No c/o pain during tx.         Frequency Min 2X/week     Progress Toward Goals  OT Goals(current goals can now be found in the care plan section)  Progress towards OT goals:  (All education completed)  Acute Rehab OT Goals Patient Stated Goal: to go home today OT Goal Formulation: With patient Time For Goal Achievement: 04/13/14 Potential to Achieve Goals: Good  Plan Discharge plan remains appropriate       End of Session Equipment Utilized During Treatment: Gait belt CPM Right Knee CPM Right Knee: Off   Activity Tolerance Patient tolerated treatment well   Patient Left with call bell/phone within reach;with family/visitor present;in chair  Time:  -     Charges:    Maurene Capes 04/07/2014, 11:49 AM

## 2014-04-07 NOTE — Progress Notes (Signed)
SPORTS MEDICINE AND JOINT REPLACEMENT  Georgena Spurling, MD   Altamese Cabal, PA-C [address] [place], [place], [place]  [address]                             [phone]   PROGRESS NOTE  Subjective:  negative for Chest Pain  negative for Shortness of Breath  negative for Nausea/Vomiting   negative for Calf Pain  negative for Bowel Movement   Tolerating Diet: yes         Patient reports pain as 5 on 0-10 scale.    Objective: Vital signs in last 24 hours:   Patient Vitals for the past 24 hrs:  BP Temp Temp src Pulse Resp SpO2  04/07/14 1300 134/57 mmHg 100.5 F (38.1 C) - 94 16 100 %  04/07/14 1134 152/82 mmHg - - - - -  04/07/14 1133 152/82 mmHg - - - - -  04/07/14 0504 152/82 mmHg 98.4 F (36.9 C) Oral 86 16 100 %  04/07/14 0400 - - - - 16 100 %  04/07/14 0111 - 98.5 F (36.9 C) Oral - - -  04/07/14 0000 - - - - 16 100 %  04/06/14 2000 - - - - 16 100 %  04/06/14 1949 152/84 mmHg 99.3 F (37.4 C) - 103 16 100 %  04/06/14 1600 - - - - 16 96 %    @flow {1959:LAST@   Intake/Output from previous day:   08/04 0701 - 08/05 0700 In: 924 [P.O.:240; I.V.:684] Out: -    Intake/Output this shift:   08/05 0701 - 08/05 1900 In: 240 [P.O.:240] Out: -    Intake/Output     08/04 0701 - 08/05 0700 08/05 0701 - 08/06 0700   P.O. 240 240   I.V. (mL/kg) 684 (7)    Total Intake(mL/kg) 924 (9.5) 240 (2.5)   Urine (mL/kg/hr)     Drains     Blood     Total Output       Net +924 +240        Urine Occurrence 7 x 1 x      LABORATORY DATA:  Recent Labs  04/05/14 1410 04/06/14 0603 04/07/14 0550  WBC 10.1 12.8* 12.7*  HGB 12.0 11.0* 9.5*  HCT 36.8 34.1* 28.8*  PLT 270 246 218    Recent Labs  04/05/14 1410 04/06/14 0603  NA  --  140  K  --  3.6*  CL  --  103  CO2  --  21  BUN  --  20  CREATININE 1.05 0.91  GLUCOSE  --  131*  CALCIUM  --  8.7   Lab Results  Component Value Date   INR 0.93 03/25/2014    Examination:  General appearance: alert, cooperative  and no distress Extremities: extremities normal, atraumatic, no cyanosis or edema and Homans sign is negative, no sign of DVT  Wound Exam: clean, dry, intact   Drainage:  None: wound tissue dry  Motor Exam: EHL and FHL Intact  Sensory Exam: Deep Peroneal normal   Assessment:    2 Days Post-Op  Procedure(s) (LRB): TOTAL KNEE ARTHROPLASTY (Right)  ADDITIONAL DIAGNOSIS:  Active Problems:   S/P total knee replacement using cement  Acute Blood Loss Anemia   Plan: Physical Therapy as ordered Weight Bearing as Tolerated (WBAT)  DVT Prophylaxis:  Aspirin  DISCHARGE PLAN: Home  DISCHARGE NEEDS: HHPT, CPM, Walker and 3-in-1 comode seat  Rockford Leinen 04/07/2014, 2:10 PM

## 2014-04-07 NOTE — Progress Notes (Signed)
Physical Therapy Treatment Patient Details Name: Savannah Reese MRN: [id number] DOB: 24-Sep-1955 Today's Date: 04/07/2014    History of Present Illness 58 y.o. female s/p right total knee arthroplasty. Hx of HTN.    PT Comments    Patient continues to progress towards physical therapy goals, ambulating up to 75 feet with min guard assist while using the rolling walker and intermittent verbal cues for technique. Slowly improving safety with sit<>stand transfer. Feel patient will be adequate for d/c home tomorrow from a PT standpoint with 24 hour care from family and HHPT. Will see in AM for further safety training with transfers.   Follow Up Recommendations  Home health PT;Supervision for mobility/OOB     Equipment Recommendations  Rolling walker with 5" wheels;3in1 (PT)    Recommendations for Other Services OT consult     Precautions / Restrictions Precautions Precautions: Knee Precaution Comments: Reviewed knee precautions Restrictions Weight Bearing Restrictions: Yes RLE Weight Bearing: Weight bearing as tolerated    Mobility  Bed Mobility Overal bed mobility: Needs Assistance Bed Mobility: Supine to Sit     Supine to sit: Supervision Sit to supine: Min assist   General bed mobility comments: Supervision for safety. Educated on use of  LLE to support RLE out of bed. Did not require physical assist.  Transfers Overall transfer level: Needs assistance Equipment used: Rolling walker (2 wheeled) Transfers: Sit to/from Stand Sit to Stand: Min guard         General transfer comment: Continue to need frequent cues for upright posture with transfer to standing position. Demonstrated to patient and verbalizes understanding. Leans significantly over rolling walker. Performed from lowest bed setting and BSC. VC for hand placement with sit onto [facility]  Ambulation/Gait Ambulation/Gait assistance: Min guard Ambulation Distance (Feet): 75 Feet (additional bout of 15  feet) Assistive device: Rolling walker (2 wheeled) Gait Pattern/deviations: Step-to pattern;Decreased step length - left;Decreased stance time - right;Antalgic;Trunk flexed;Decreased dorsiflexion - right   Gait velocity interpretation: Below normal speed for age/gender General Gait Details: Very slow and guarded gait. Required several standing rest breaks to complete distance however no buckling noted. VC for upright posture and educated on walker placement for stability.  Unable to perform step through gait pattern due to pain. Continues to have poor heel strike on RLE and it is reported this was baseline due to dorsiflexion weakness per daughter.   Stairs            Wheelchair Mobility    Modified Rankin (Stroke Patients Only)       Balance                                    Cognition Arousal/Alertness: Awake/alert Behavior During Therapy: WFL for tasks assessed/performed Overall Cognitive Status: Difficult to assess                      Exercises Total Joint Exercises Quad Sets: AROM;Right;10 reps;Supine Goniometric ROM: 6-86 degrees active flexion in sitting position    General Comments        Pertinent Vitals/Pain Pt reports pain is controlled via interpreter. States she will wait to call for pain medication Patient repositioned in chair for comfort. Off CPM at 1511    Home Living                      Prior Function  PT Goals (current goals can now be found in the care plan section) Acute Rehab PT Goals PT Goal Formulation: With patient Time For Goal Achievement: 04/12/14 Potential to Achieve Goals: Good Progress towards PT goals: Progressing toward goals    Frequency  7X/week    PT Plan Current plan remains appropriate    Co-evaluation             End of Session   Activity Tolerance: Patient tolerated treatment well Patient left: with call bell/phone within reach;with family/visitor present;in  chair     Time: [id number] PT Time Calculation (min): 35 min  Charges:  $Gait Training: 8-22 mins $Therapeutic Activity: 8-22 mins                    G Codes:      [facility] [address] [place], [place] [phone]  [place], Dicie Edelen S 04/07/2014, 4:43 PM

## 2014-04-07 NOTE — Progress Notes (Signed)
Physical Therapy Treatment Patient Details Name: Savannah Reese MRN: [id number] DOB: 02/14/1956 Today's Date: 04/07/2014    History of Present Illness 58 y.o. female s/p right total knee arthroplasty. Hx of HTN.    PT Comments    Patient is progressing towards physical therapy goals, ambulating up to 60 feet today with min guard assist while using a rolling walker. Requires moderate cues for technique but overall is progressing nicely. Patient will continue to benefit from skilled physical therapy services to further improve independence with functional mobility.    Follow Up Recommendations  Home health PT;Supervision for mobility/OOB     Equipment Recommendations  Rolling walker with 5" wheels;3in1 (PT)    Recommendations for Other Services OT consult     Precautions / Restrictions Precautions Precautions: Knee Precaution Comments: Reviewed knee precautions Restrictions Weight Bearing Restrictions: Yes RLE Weight Bearing: Weight bearing as tolerated    Mobility  Bed Mobility Overal bed mobility: Needs Assistance Bed Mobility: Supine to Sit;Sit to Supine     Supine to sit: Supervision Sit to supine: Min assist   General bed mobility comments: Supervision for safety with cues for technique. no physical assist needed. Requires extra time. Min assist for RLE support with sit to supine transition.  Transfers Overall transfer level: Needs assistance Equipment used: Rolling walker (2 wheeled) Transfers: Sit to/from Stand Sit to Stand: Min guard         General transfer comment: Min guard for safety. VC for hand placement. Continues to lean heavily over RW. Performed from recliner and BSC.  Ambulation/Gait Ambulation/Gait assistance: Min guard Ambulation Distance (Feet): 60 Feet (additional bout of 15 feet) Assistive device: Rolling walker (2 wheeled) Gait Pattern/deviations: Step-to pattern;Decreased step length - left;Decreased stance time - right;Antalgic;Trunk  flexed   Gait velocity interpretation: Below normal speed for age/gender General Gait Details: Improved gait sequencing with further instructions today. Continues to need cues for upright posture. Required several standing rest breaks during bout. No instances of buckling.   Stairs            Wheelchair Mobility    Modified Rankin (Stroke Patients Only)       Balance Overall balance assessment: Needs assistance Sitting-balance support: Feet supported;No upper extremity supported Sitting balance-Leahy Scale: Good     Standing balance support: Single extremity supported Standing balance-Leahy Scale: Poor                      Cognition Arousal/Alertness: Awake/alert Behavior During Therapy: WFL for tasks assessed/performed Overall Cognitive Status: Difficult to assess                      Exercises      General Comments        Pertinent Vitals/Pain Pt requests pain medication but does not report pain level numerically. Nurse notified Patient repositioned in bed for comfort. CPM on at 12:18 from 0-90 degrees flexion    Home Living                      Prior Function            PT Goals (current goals can now be found in the care plan section) Acute Rehab PT Goals Patient Stated Goal: to go home today PT Goal Formulation: With patient Time For Goal Achievement: 04/12/14 Potential to Achieve Goals: Good Progress towards PT goals: Progressing toward goals    Frequency  7X/week    PT Plan Current plan  remains appropriate    Co-evaluation             End of Session   Activity Tolerance: Patient tolerated treatment well Patient left: with call bell/phone within reach;with family/visitor present;in bed;in CPM     Time: 1610-9604 PT Time Calculation (min): 26 min  Charges:  $Gait Training: 8-22 mins $Therapeutic Activity: 8-22 mins                    G Codes:      [employer], [place] [phone]  Berton Mount 04/07/2014, 1:56 PM

## 2014-04-07 NOTE — Discharge Instructions (Signed)
Diet: As you were doing prior to hospitalization  ° °Activity:  Increase activity slowly as tolerated  °                No lifting or driving for 6 weeks ° °Shower:  May shower without a dressing once there is no drainage from your wound. °                Do NOT wash over the wound. °                °Dressing:  You may change your dressing on Friday °                   Then change the dressing daily with sterile 4"x4"s gauze dressing  °                   And TED hose for knees. ° °Weight Bearing:  Weight bearing as tolerated as taught in physical therapy.  Use a                                walker or Crutches as instructed. ° °To prevent constipation: you may use a stool softener such as - °              Colace ( over the counter) 100 mg by mouth twice a day  °              Drink plenty of fluids ( prune juice may be helpful) and high fiber foods °               Miralax ( over the counter) for constipation as needed.   ° °Precautions:  If you experience chest pain or shortness of breath - call 911 immediately               For transfer to the hospital emergency department!! °              If you develop a fever greater that 101 F, purulent drainage from wound,                             increased redness or drainage from wound, or calf pain -- Call the office. ° °Follow- Up Appointment:  Please call for an appointment to be seen on 04/20/14 °                                             [place] office:  [phone] °           [address] [place], [place] [address] °               ° ° °

## 2014-04-07 NOTE — Progress Notes (Signed)
Pt removes footsie roll to lay on sholder and hip l side. Pt encouraged to keep rle straight, family at bedside non supportive of post surgical care

## 2014-04-07 NOTE — Progress Notes (Signed)
Agree with note. [phone] 2SC 04/07/2014 Martie RoundJulie Tonita Bills, OTR/L Pager: [phone]

## 2014-04-08 ENCOUNTER — Encounter (HOSPITAL_COMMUNITY): Payer: Self-pay | Admitting: Orthopedic Surgery

## 2014-04-08 LAB — CBC
HCT: 26.3 % — ABNORMAL LOW (ref 36.0–46.0)
Hemoglobin: 8.7 g/dL — ABNORMAL LOW (ref 12.0–15.0)
MCH: 28.4 pg (ref 26.0–34.0)
MCHC: 33.1 g/dL (ref 30.0–36.0)
MCV: 85.9 fL (ref 78.0–100.0)
PLATELETS: 214 10*3/uL (ref 150–400)
RBC: 3.06 MIL/uL — AB (ref 3.87–5.11)
RDW: 14.4 % (ref 11.5–15.5)
WBC: 11.9 10*3/uL — ABNORMAL HIGH (ref 4.0–10.5)

## 2014-04-08 NOTE — Progress Notes (Signed)
D/C instructions and scripts given to Pt and her daughter. Reviewed paper work with Pts daughter due to Pt not able to speak english. The daughter verbalized understanding of instructions and asked appropriate questions as needed. Pt taken by wheelchair to daughter car at this time.

## 2014-04-08 NOTE — Progress Notes (Signed)
Physical Therapy Treatment Patient Details Name: Savannah Reese MRN: [id number] DOB: [birth year] Today's Date: 04/08/2014    History of Present Illness 58 y.o. female s/p right total knee arthroplasty. Hx of HTN.    PT Comments    Pt was seen with family present after first attempt with only bed mob done.  Daughter plans to care for pt and was in to observe pt with her 80 month old son.  Pt is not speaking much to daughter in her language but has demonstrated understanding of most of therapy safety instruction.  She needs to be supervised on longer gait trips as her fatigue becomes evident in a slight shuffle of gait.  Follow Up Recommendations  Home health PT     Equipment Recommendations  Rolling walker with 5" wheels    Recommendations for Other Services       Precautions / Restrictions Precautions Precautions: Knee;Fall Precaution Comments: Reviewed knee precautions (with daughter) Restrictions Weight Bearing Restrictions: Yes RLE Weight Bearing: Weight bearing as tolerated    Mobility  Bed Mobility Overal bed mobility: Needs Assistance Bed Mobility: Rolling;Supine to Sit Rolling: Supervision   Supine to sit: Supervision     General bed mobility comments: Assistance to move CPM then pt is able to slide to EOB  Transfers Overall transfer level: Needs assistance Equipment used: Rolling walker (2 wheeled) Transfers: Sit to/from Stand Sit to Stand: Min guard         General transfer comment: Min guard for safety from recliner. Demonstrates correct hand placement from stable surface. Improved posture with less leaning over RW.  Ambulation/Gait Ambulation/Gait assistance: Supervision Ambulation Distance (Feet): 110 Feet Assistive device: Rolling walker (2 wheeled) Gait Pattern/deviations: Step-through pattern;Decreased stance time - right Gait velocity: slow Gait velocity interpretation: Below normal speed for age/gender General Gait Details: steps lengthened and  smoothed out as pt walked today   Stairs            Wheelchair Mobility    Modified Rankin (Stroke Patients Only)       Balance Overall balance assessment: Modified Independent Sitting-balance support: Feet supported Sitting balance-Leahy Scale: Good     Standing balance support: Bilateral upper extremity supported Standing balance-Leahy Scale: Fair                      Cognition Arousal/Alertness: Awake/alert Behavior During Therapy: WFL for tasks assessed/performed Overall Cognitive Status: Difficult to assess                      Exercises      General Comments General comments (skin integrity, edema, etc.): Early session from 1345 to 1400 as pt was without family member and needed to work on some bed mob, but pt called her daughter and she asked PT to wait to finish session.      Pertinent Vitals/Pain  Pt did not express a pain rating but rather demonstrates a calm expression.  BP was 112/68, pulse 92 and O2 sat 100% per nsg notes.    Home Living                      Prior Function            PT Goals (current goals can now be found in the care plan section) Acute Rehab PT Goals Patient Stated Goal: did not state Progress towards PT goals: Progressing toward goals    Frequency  7X/week    PT Plan Current  plan remains appropriate    Co-evaluation             End of Session   Activity Tolerance: Patient tolerated treatment well Patient left: in chair;with call bell/phone within reach;with family/visitor present     Time: [id number] PT Time Calculation (min): 17 min  Charges:  $Gait Training: 8-22 mins $Therapeutic Activity: 8-22 mins                    G Codes:      Ivar DrapeStout, Janalyn Higby E 04/08/2014, 5:25 PM

## 2014-04-08 NOTE — Progress Notes (Signed)
Physical Therapy Treatment Patient Details Name: Savannah Reese MRN: [id number] DOB: 02/11/1956 Today's Date: 04/08/2014    History of Present Illness 58 y.o. female s/p right total knee arthroplasty. Hx of HTN.    PT Comments    Patient is progressing well towards physical therapy goals, ambulating up to 90 feet with supervision and although very slow, she demonstrates good stability and balance while using the rolling walker. Reviewed handout for therapeutic exercises with pt and family members. Feel she is adequate for d/c from a mobility standpoint. Patient will continue to benefit from skilled physical therapy services at home with HHPT to further improve independence with functional mobility.    Follow Up Recommendations  Home health PT;Supervision for mobility/OOB     Equipment Recommendations  Rolling walker with 5" wheels;3in1 (PT)    Recommendations for Other Services OT consult     Precautions / Restrictions Precautions Precautions: Knee Precaution Comments: Reviewed knee precautions Restrictions Weight Bearing Restrictions: Yes RLE Weight Bearing: Weight bearing as tolerated    Mobility  Bed Mobility Overal bed mobility: Needs Assistance Bed Mobility: Supine to Sit     Supine to sit: Supervision Sit to supine: Min assist   General bed mobility comments: Continues to need min assist with sit>supine for RLE support. However is supervision with supine>sit..  Transfers Overall transfer level: Needs assistance Equipment used: Rolling walker (2 wheeled) Transfers: Sit to/from Stand Sit to Stand: Min guard         General transfer comment: Min guard for safety from recliner. Demonstrates correct hand placement from stable surface. Improved posture with less leaning over RW.  Ambulation/Gait Ambulation/Gait assistance: Supervision Ambulation Distance (Feet): 90 Feet Assistive device: Rolling walker (2 wheeled) Gait Pattern/deviations: Step-to  pattern;Decreased step length - left;Decreased stance time - right;Antalgic   Gait velocity interpretation: Below normal speed for age/gender General Gait Details: Slightly improved gait speed today and better posture. VC for forward gaze and intermittent cues for walker placement. No instances of buckling noted during ambulatory bout.   Stairs            Wheelchair Mobility    Modified Rankin (Stroke Patients Only)       Balance                                    Cognition Arousal/Alertness: Awake/alert Behavior During Therapy: WFL for tasks assessed/performed Overall Cognitive Status: Difficult to assess                      Exercises Total Joint Exercises Ankle Circles/Pumps: AROM;Both;10 reps;Supine Quad Sets: AROM;Right;10 reps;Supine Short Arc Quad: AROM;Right;10 reps;Supine Heel Slides: AAROM;Right;10 reps;Supine Straight Leg Raises: AAROM;Right;10 reps;Supine    General Comments        Pertinent Vitals/Pain Daughter reports "she is okay" when PT asks about pain level. Daughter interpreted response. Patient repositioned in bed for comfort. CPM on at 1053 from 0-65 degrees.     Home Living                      Prior Function            PT Goals (current goals can now be found in the care plan section) Acute Rehab PT Goals PT Goal Formulation: With patient Time For Goal Achievement: 04/12/14 Potential to Achieve Goals: Good Progress towards PT goals: Progressing toward goals    Frequency  7X/week  PT Plan Current plan remains appropriate    Co-evaluation             End of Session   Activity Tolerance: Patient tolerated treatment well Patient left: with call bell/phone within reach;with family/visitor present;in bed;in CPM     Time: 1025-1053 PT Time Calculation (min): 28 min  Charges:  $Gait Training: 8-22 mins $Therapeutic Exercise: 8-22 mins                    G Codes:      [employer], [place] [phone]  Berton Mount 04/08/2014, 11:13 AM

## 2014-04-09 NOTE — Discharge Summary (Signed)
SPORTS MEDICINE & JOINT REPLACEMENT   Georgena SpurlingStephen Lucey, MD   Altamese CabalMaurice Druanne Bosques, PA-C [address], [place], [place]  [address]                             [phone]  PATIENT ID: Savannah Reese        MRN:  [id number]          DOB/AGE: 60/11/1955 / 60 y.o.    DISCHARGE SUMMARY  ADMISSION DATE:    04/05/2014 DISCHARGE DATE:   04/08/2014  ADMISSION DIAGNOSIS: osteoarthritis right knee    DISCHARGE DIAGNOSIS:  osteoarthritis right knee    ADDITIONAL DIAGNOSIS: Active Problems:   S/P total knee replacement using cement  Past Medical History  Diagnosis Date   Hypertension    Arthritis     PROCEDURE: Procedure(s): TOTAL KNEE ARTHROPLASTY on 04/05/2014  CONSULTS:     HISTORY:  See H&P in chart  HOSPITAL COURSE:  Savannah Reese is a 58 y.o. admitted on 04/05/2014 and found to have a diagnosis of osteoarthritis right knee.  After appropriate laboratory studies were obtained  they were taken to the operating room on 04/05/2014 and underwent Procedure(s): TOTAL KNEE ARTHROPLASTY.   They were given perioperative antibiotics:  Anti-infectives   Start     Dose/Rate Route Frequency Ordered Stop   04/05/14 1500  ceFAZolin (ANCEF) IVPB 2 g/50 mL premix     2 g 100 mL/hr over 30 Minutes Intravenous Every 6 hours 04/05/14 1237 04/05/14 2214   04/05/14 0915  ceFAZolin (ANCEF) IVPB 2 g/50 mL premix     2 g 100 mL/hr over 30 Minutes Intravenous  Once 04/05/14 0910 04/05/14 0900   04/04/14 1049  ceFAZolin (ANCEF) IVPB 2 g/50 mL premix  Status:  Discontinued     2 g 100 mL/hr over 30 Minutes Intravenous On call to O.R. 04/04/14 1049 04/05/14 1237    .  Tolerated the procedure well.  Placed with a foley intraoperatively.  Given Ofirmev at induction and for 48 hours.    POD# 1: Vital signs were stable.  Patient denied Chest pain, shortness of breath, or calf pain.  Patient was started on Lovenox 30 mg subcutaneously twice daily at 8am.  Consults to PT, OT, and care management were made.  The  patient was weight bearing as tolerated.  CPM was placed on the operative leg 0-90 degrees for 6-8 hours a day.  Incentive spirometry was taught.  Dressing was changed.  Hemovac was discontinued.      POD #2, Continued  PT for ambulation and exercise program.  IV saline locked.  O2 discontinued.    The remainder of the hospital course was dedicated to ambulation and strengthening.   The patient was discharged on 3 days post op in  Good condition.  Blood products given:none  DIAGNOSTIC STUDIES: Recent vital signs: Patient Vitals for the past 24 hrs:  Resp  04/08/14 1549 18       Recent laboratory studies:  Recent Labs  04/05/14 1410 04/06/14 0603 04/07/14 0550 04/08/14 0645  WBC 10.1 12.8* 12.7* 11.9*  HGB 12.0 11.0* 9.5* 8.7*  HCT 36.8 34.1* 28.8* 26.3*  PLT 270 246 218 214    Recent Labs  04/05/14 1410 04/06/14 0603  NA  --  140  K  --  3.6*  CL  --  103  CO2  --  21  BUN  --  20  CREATININE 1.05 0.91  GLUCOSE  --  131*  CALCIUM  --  8.7   Lab Results  Component Value Date   INR 0.93 03/25/2014     Recent Radiographic Studies :  Dg Chest 2 View  03/25/2014   CLINICAL DATA:  Pre-admission for right knee replacement  EXAM: CHEST  2 VIEW  COMPARISON:  None.  FINDINGS: Cardiomediastinal silhouette is unremarkable. Dextroscoliosis lower thoracic spine. No acute infiltrate or pleural effusion. No pulmonary edema.  IMPRESSION: No active disease.  Dextroscoliosis lower thoracic spine.   Electronically Signed   By: Natasha Mead M.D.   On: 03/25/2014 09:38    DISCHARGE INSTRUCTIONS: Discharge Instructions   CPM    Complete by:  As directed   Continuous passive motion machine (CPM):      Use the CPM from 0 to 90 for 6-8 hours per day.      You may increase by 10 per day.  You may break it up into 2 or 3 sessions per day.      Use CPM for 2 weeks or until you are told to stop.     Call MD / Call 911    Complete by:  As directed   If you experience chest pain or  shortness of breath, CALL 911 and be transported to the hospital emergency room.  If you develope a fever above 101 F, pus (white drainage) or increased drainage or redness at the wound, or calf pain, call your surgeon's office.     Change dressing    Complete by:  As directed   Change dressing on Friday, then change the dressing daily with sterile 4 x 4 inch gauze dressing and apply TED hose.     Constipation Prevention    Complete by:  As directed   Drink plenty of fluids.  Prune juice may be helpful.  You may use a stool softener, such as Colace (over the counter) 100 mg twice a day.  Use MiraLax (over the counter) for constipation as needed.     Diet - low sodium heart healthy    Complete by:  As directed      Do not put a pillow under the knee. Place it under the heel.    Complete by:  As directed      Driving restrictions    Complete by:  As directed   No driving for 6 weeks     Increase activity slowly as tolerated    Complete by:  As directed      Lifting restrictions    Complete by:  As directed   No lifting for 6 weeks     TED hose    Complete by:  As directed   Use stockings (TED hose) for 3 weeks on both leg(s).  You may remove them at night for sleeping.           DISCHARGE MEDICATIONS:     Medication List    STOP taking these medications       atenolol-chlorthalidone 50-25 MG per tablet  Commonly known as:  TENORETIC      TAKE these medications       amLODipine 5 MG tablet  Commonly known as:  NORVASC  Take 5 mg by mouth at bedtime.     aspirin EC 325 MG tablet  Take 1 tablet (325 mg total) by mouth daily.     ibuprofen 800 MG tablet  Commonly known as:  ADVIL,MOTRIN  Take 800 mg by mouth 3 (three) times daily as needed  for mild pain.     lisinopril 20 MG tablet  Commonly known as:  PRINIVIL,ZESTRIL  Take 20 mg by mouth daily.     methocarbamol 500 MG tablet  Commonly known as:  ROBAXIN  Take 1-2 tablets (500-1,000 mg total) by mouth every 6 (six)  hours as needed for muscle spasms.     oxyCODONE 5 MG immediate release tablet  Commonly known as:  Oxy IR/ROXICODONE  Take 1-2 tablets (5-10 mg total) by mouth every 4 (four) hours as needed for breakthrough pain.     OxyCODONE 10 mg T12a 12 hr tablet  Commonly known as:  OXYCONTIN  Take 1 tablet (10 mg total) by mouth every 12 (twelve) hours.     pravastatin 40 MG tablet  Commonly known as:  PRAVACHOL  Take 40 mg by mouth at bedtime.        FOLLOW UP VISIT:       Follow-up Information   Follow up with [facility]. (Someone from [facility] will contact you concerning start date and time for physical therapy.)    Contact information:   [address] [place] [address] [phone]       Follow up with Raymon Mutton, MD. Call on 04/20/2014.   Specialty:  Orthopedic Surgery   Contact information:   [address] [place] [address] [phone]       DISPOSITION: HOME CONDITION:  Good   Espyn Radwan 04/09/2014, 3:41 PM

## 2014-04-10 NOTE — Consent Form (Signed)
°

## 2014-04-10 NOTE — Patient Instructions (Signed)
°

## 2014-04-10 NOTE — Procedures (Signed)
°

## 2014-04-20 DIAGNOSIS — Z96659 Presence of unspecified artificial knee joint: Secondary | ICD-10-CM

## 2015-10-18 DIAGNOSIS — M1712 Unilateral primary osteoarthritis, left knee: Secondary | ICD-10-CM | POA: Insufficient documentation

## 2015-11-07 DIAGNOSIS — I10 Essential (primary) hypertension: Secondary | ICD-10-CM | POA: Insufficient documentation

## 2015-11-29 DIAGNOSIS — Z6834 Body mass index (BMI) 34.0-34.9, adult: Secondary | ICD-10-CM

## 2015-11-29 DIAGNOSIS — E6609 Other obesity due to excess calories: Secondary | ICD-10-CM | POA: Insufficient documentation

## 2015-11-29 DIAGNOSIS — R7303 Prediabetes: Secondary | ICD-10-CM | POA: Insufficient documentation

## 2015-11-29 DIAGNOSIS — E66811 Obesity, class 1: Secondary | ICD-10-CM | POA: Insufficient documentation

## 2016-04-13 ENCOUNTER — Other Ambulatory Visit: Payer: Self-pay | Admitting: Orthopedic Surgery

## 2016-05-18 ENCOUNTER — Encounter (HOSPITAL_COMMUNITY)
Admission: RE | Admit: 2016-05-18 | Discharge: 2016-05-18 | Disposition: A | Discharge status: Home / Self Care | Payer: Self-pay | Source: Ambulatory Visit | Attending: Orthopedic Surgery | Admitting: Orthopedic Surgery

## 2016-05-18 ENCOUNTER — Ambulatory Visit (HOSPITAL_COMMUNITY)
Admission: RE | Admit: 2016-05-18 | Discharge: 2016-05-18 | Disposition: A | Discharge status: Home / Self Care | Payer: Self-pay | Source: Ambulatory Visit | Attending: Orthopedic Surgery | Admitting: Orthopedic Surgery

## 2016-05-18 ENCOUNTER — Encounter (HOSPITAL_COMMUNITY): Payer: Self-pay

## 2016-05-18 DIAGNOSIS — Z01818 Encounter for other preprocedural examination: Secondary | ICD-10-CM

## 2016-05-18 DIAGNOSIS — M1712 Unilateral primary osteoarthritis, left knee: Secondary | ICD-10-CM | POA: Insufficient documentation

## 2016-05-18 DIAGNOSIS — Z01812 Encounter for preprocedural laboratory examination: Secondary | ICD-10-CM | POA: Insufficient documentation

## 2016-05-18 DIAGNOSIS — Z01810 Encounter for preprocedural cardiovascular examination: Secondary | ICD-10-CM | POA: Insufficient documentation

## 2016-05-18 LAB — COMPREHENSIVE METABOLIC PANEL
ALBUMIN: 3.9 g/dL (ref 3.5–5.0)
ALK PHOS: 54 U/L (ref 38–126)
ALT: 12 U/L — AB (ref 14–54)
ANION GAP: 8 (ref 5–15)
AST: 16 U/L (ref 15–41)
BUN: 15 mg/dL (ref 6–20)
CALCIUM: 9.5 mg/dL (ref 8.9–10.3)
CHLORIDE: 105 mmol/L (ref 101–111)
CO2: 25 mmol/L (ref 22–32)
Creatinine, Ser: 0.86 mg/dL (ref 0.44–1.00)
GFR calc Af Amer: >60 mL/min (ref >60)
GFR calc non Af Amer: >60 mL/min (ref >60)
GLUCOSE: 94 mg/dL (ref 65–99)
Potassium: 3.5 mmol/L (ref 3.5–5.1)
SODIUM: 138 mmol/L (ref 135–145)
Total Bilirubin: 0.5 mg/dL (ref 0.3–1.2)
Total Protein: 8.2 g/dL — ABNORMAL HIGH (ref 6.5–8.1)

## 2016-05-18 LAB — SURGICAL PCR SCREEN
MRSA, PCR: NEGATIVE
Staphylococcus aureus: POSITIVE — AB

## 2016-05-18 LAB — CBC WITH DIFFERENTIAL/PLATELET
BASOS PCT: 1 %
Basophils Absolute: 0 10*3/uL (ref 0.0–0.1)
EOS ABS: 0.1 10*3/uL (ref 0.0–0.7)
EOS PCT: 2 %
HCT: 42.3 % (ref 36.0–46.0)
HEMOGLOBIN: 13.5 g/dL (ref 12.0–15.0)
Lymphocytes Relative: 45 %
Lymphs Abs: 2.9 10*3/uL (ref 0.7–4.0)
MCH: 28.3 pg (ref 26.0–34.0)
MCHC: 31.9 g/dL (ref 30.0–36.0)
MCV: 88.7 fL (ref 78.0–100.0)
Monocytes Absolute: 0.4 10*3/uL (ref 0.1–1.0)
Monocytes Relative: 7 %
NEUTROS PCT: 45 %
Neutro Abs: 2.9 10*3/uL (ref 1.7–7.7)
PLATELETS: 297 10*3/uL (ref 150–400)
RBC: 4.77 MIL/uL (ref 3.87–5.11)
RDW: 14 % (ref 11.5–15.5)
WBC: 6.5 10*3/uL (ref 4.0–10.5)

## 2016-05-18 LAB — URINALYSIS, ROUTINE W REFLEX MICROSCOPIC
Bilirubin Urine: NEGATIVE
Glucose, UA: NEGATIVE mg/dL
HGB URINE DIPSTICK: NEGATIVE
Ketones, ur: NEGATIVE mg/dL
LEUKOCYTES UA: NEGATIVE
Nitrite: NEGATIVE
PROTEIN: NEGATIVE mg/dL
SPECIFIC GRAVITY, URINE: 1.024 (ref 1.005–1.030)
pH: 6 (ref 5.0–8.0)

## 2016-05-18 LAB — PROTIME-INR
INR: 1.03
Prothrombin Time: 13.5 seconds (ref 11.4–15.2)

## 2016-05-18 NOTE — Pre-Procedure Instructions (Addendum)
Savannah Reese  05/18/2016      [id number] - [place], [place] - [address] [place] [address] [place] [address] Phone: [phone] Fax: [phone]  [facility] - [name], [place] - [address] AT [name] [address] [place] [address] Phone: [phone] Fax: [phone]    Your procedure is scheduled on 05/28/16  Report to [facility] Admitting at 530 A.M.  Call this number if you have problems the morning of surgery:  [phone]   Remember:  Do not eat food or drink liquids after midnight.  Take these medicines the morning of surgery with A SIP OF WATER atenolol(tenormin)  STOP all herbel meds, nsaids (aleve,naproxen,advil,ibuprofen) 7 days prior to surgery starting 9./19/17 including aspirin, vitamins   Do not wear jewelry, make-up or nail polish.  Do not wear lotions, powders, or perfumes, or deoderant.  Do not shave 48 hours prior to surgery.  Men may shave face and neck.  Do not bring valuables to the hospital.  [facility] is not responsible for any belongings or valuables.  Contacts, dentures or bridgework may not be worn into surgery.  Leave your suitcase in the car.  After surgery it may be brought to your room.  For patients admitted to the hospital, discharge time will be determined by your treatment team.  Patients discharged the day of surgery will not be allowed to drive home.   Name and phone number of your driver:   Special instructions:   Special Instructions: [facility] - Preparing for Surgery  Before surgery, you can play an important role.  Because skin is not sterile, your skin needs to be as free of germs as possible.  You can reduce the number of germs on you skin by washing with CHG (chlorahexidine gluconate) soap before surgery.  CHG is an antiseptic cleaner which kills germs and bonds with the skin to continue killing germs even after washing.  Please  DO NOT use if you have an allergy to CHG or antibacterial soaps.  If your skin becomes reddened/irritated stop using the CHG and inform your nurse when you arrive at Short Stay.  Do not shave (including legs and underarms) for at least 48 hours prior to the first CHG shower.  You may shave your face.  Please follow these instructions carefully:   1.  Shower with CHG Soap the night before surgery and the morning of Surgery.  2.  If you choose to wash your hair, wash your hair first as usual with your normal shampoo.  3.  After you shampoo, rinse your hair and body thoroughly to remove the Shampoo.  4.  Use CHG as you would any other liquid soap.  You can apply chg directly  to the skin and wash gently with scrungie or a clean washcloth.  5.  Apply the CHG Soap to your body ONLY FROM THE NECK DOWN.  Do not use on open wounds or open sores.  Avoid contact with your eyes ears, mouth and genitals (private parts).  Wash genitals (private parts)       with your normal soap.  6.  Wash thoroughly, paying special attention to the area where your surgery will be performed.  7.  Thoroughly rinse your body with warm water from the neck down.  8.  DO NOT shower/wash with your normal soap after using and rinsing off the CHG Soap.  9.  Savannah Reese  yourself dry with a clean towel.            10.  Wear clean pajamas.            11.  Place clean sheets on your bed the night of your first shower and do not sleep with pets.  Day of Surgery  Do not apply any lotions/deodorants the morning of surgery.  Please wear clean clothes to the hospital/surgery center.  Please read over the  fact sheets that you were given.

## 2016-05-25 MED ORDER — TRANEXAMIC ACID 1000 MG/10ML IV SOLN
1000.0000 mg | INTRAVENOUS | Status: AC
  Administered 2016-05-28: 1000 mg via INTRAVENOUS
  Filled 2016-05-25: qty 10

## 2016-05-27 NOTE — Anesthesia Preprocedure Evaluation (Addendum)
Anesthesia Evaluation  Patient identified by MRN, date of birth, ID band Patient awake    Reviewed: Allergy & Precautions, H&P , NPO status , Patient's Chart, lab work & pertinent test results  Airway Mallampati: II  TM Distance: >3 FB Neck ROM: Full    Dental  (+) Teeth Intact, Poor Dentition, Dental Advisory Given   Pulmonary    Pulmonary exam normal breath sounds clear to auscultation       Cardiovascular hypertension, Pt. on medications  Rhythm:Regular Rate:Normal     Neuro/Psych    GI/Hepatic   Endo/Other    Renal/GU      Musculoskeletal  (+) Arthritis ,   Abdominal   Peds  Hematology   Anesthesia Other Findings   Reproductive/Obstetrics                            Anesthesia Physical  Anesthesia Plan  ASA: II  Anesthesia Plan: General and Regional   Post-op Pain Management:  Regional for Post-op pain   Induction: Intravenous  Airway Management Planned: LMA  Additional Equipment:   Intra-op Plan:   Post-operative Plan: Extubation in OR  Informed Consent: I have reviewed the patients History and Physical, chart, labs and discussed the procedure including the risks, benefits and alternatives for the proposed anesthesia with the patient or authorized representative who has indicated his/her understanding and acceptance.   Dental advisory given  Plan Discussed with: Anesthesiologist and Surgeon  Anesthesia Plan Comments: (Unsure why previous anesthetic for TKA was GA and not spinal, will offer patient)        Anesthesia Quick Evaluation

## 2016-05-28 ENCOUNTER — Inpatient Hospital Stay (HOSPITAL_COMMUNITY)
Admission: RE | Admit: 2016-05-28 | Discharge: 2016-05-29 | DRG: 470 | Disposition: A | Discharge status: Home Health | Payer: Self-pay | Source: Ambulatory Visit | Attending: Orthopedic Surgery | Admitting: Orthopedic Surgery

## 2016-05-28 ENCOUNTER — Inpatient Hospital Stay (HOSPITAL_COMMUNITY): Payer: Self-pay | Admitting: Anesthesiology

## 2016-05-28 ENCOUNTER — Encounter (HOSPITAL_COMMUNITY): Payer: Self-pay | Admitting: General Practice

## 2016-05-28 ENCOUNTER — Encounter (HOSPITAL_COMMUNITY)
Admission: RE | Disposition: A | Discharge status: Home Health | Payer: Self-pay | Source: Ambulatory Visit | Attending: Orthopedic Surgery

## 2016-05-28 DIAGNOSIS — Z7982 Long term (current) use of aspirin: Secondary | ICD-10-CM

## 2016-05-28 DIAGNOSIS — Z23 Encounter for immunization: Secondary | ICD-10-CM

## 2016-05-28 DIAGNOSIS — I10 Essential (primary) hypertension: Secondary | ICD-10-CM | POA: Diagnosis present

## 2016-05-28 DIAGNOSIS — M1712 Unilateral primary osteoarthritis, left knee: Principal | ICD-10-CM | POA: Diagnosis present

## 2016-05-28 DIAGNOSIS — Z96659 Presence of unspecified artificial knee joint: Secondary | ICD-10-CM

## 2016-05-28 DIAGNOSIS — Z96651 Presence of right artificial knee joint: Secondary | ICD-10-CM | POA: Diagnosis present

## 2016-05-28 HISTORY — PX: TOTAL KNEE ARTHROPLASTY: SHX125

## 2016-05-28 SURGERY — ARTHROPLASTY, KNEE, TOTAL
Anesthesia: Regional | Site: Knee | Laterality: Left

## 2016-05-28 MED ORDER — METHOCARBAMOL 1000 MG/10ML IJ SOLN
500.0000 mg | Freq: Four times a day (QID) | INTRAVENOUS | Status: DC | PRN
  Filled 2016-05-28: qty 5

## 2016-05-28 MED ORDER — SODIUM CHLORIDE 0.9 % IV SOLN
INTRAVENOUS | Status: DC
  Administered 2016-05-28 – 2016-05-29 (×2): via INTRAVENOUS

## 2016-05-28 MED ORDER — DIPHENHYDRAMINE HCL 12.5 MG/5ML PO ELIX: 12.5000 mg | ORAL_SOLUTION | ORAL | Status: DC | PRN

## 2016-05-28 MED ORDER — CEFAZOLIN SODIUM-DEXTROSE 2-4 GM/100ML-% IV SOLN
2.0000 g | INTRAVENOUS | Status: AC
  Administered 2016-05-28: 2 g via INTRAVENOUS

## 2016-05-28 MED ORDER — METHOCARBAMOL 500 MG PO TABS
500.0000 mg | ORAL_TABLET | Freq: Four times a day (QID) | ORAL | Status: DC | PRN
  Administered 2016-05-28 – 2016-05-29 (×3): 500 mg via ORAL
  Filled 2016-05-28 (×3): qty 1

## 2016-05-28 MED ORDER — EPHEDRINE SULFATE 50 MG/ML IJ SOLN
INTRAMUSCULAR | Status: DC | PRN
Start: 2016-05-28 — End: 2016-05-28
  Administered 2016-05-28 (×5): 5 mg via INTRAVENOUS

## 2016-05-28 MED ORDER — ATENOLOL 100 MG PO TABS
100.0000 mg | ORAL_TABLET | Freq: Once | ORAL | Status: DC
  Filled 2016-05-28: qty 1

## 2016-05-28 MED ORDER — FENTANYL CITRATE (PF) 100 MCG/2ML IJ SOLN
INTRAMUSCULAR | Status: AC
  Filled 2016-05-28: qty 2

## 2016-05-28 MED ORDER — HYDROMORPHONE HCL 1 MG/ML IJ SOLN
0.2500 mg | INTRAMUSCULAR | Status: DC | PRN
  Administered 2016-05-28 (×3): 0.5 mg via INTRAVENOUS

## 2016-05-28 MED ORDER — BUPIVACAINE-EPINEPHRINE (PF) 0.5% -1:200000 IJ SOLN
INTRAMUSCULAR | Status: AC
  Filled 2016-05-28: qty 30

## 2016-05-28 MED ORDER — ONDANSETRON HCL 4 MG/2ML IJ SOLN
INTRAMUSCULAR | Status: DC | PRN
  Administered 2016-05-28: 4 mg via INTRAVENOUS

## 2016-05-28 MED ORDER — HYDROMORPHONE HCL 1 MG/ML IJ SOLN
INTRAMUSCULAR | Status: AC
  Filled 2016-05-28: qty 1

## 2016-05-28 MED ORDER — OXYCODONE HCL ER 10 MG PO T12A
10.0000 mg | EXTENDED_RELEASE_TABLET | Freq: Two times a day (BID) | ORAL | Status: DC
  Administered 2016-05-28 – 2016-05-29 (×3): 10 mg via ORAL
  Filled 2016-05-28 (×3): qty 1

## 2016-05-28 MED ORDER — HYDROMORPHONE HCL 1 MG/ML IJ SOLN
1.0000 mg | INTRAMUSCULAR | Status: DC | PRN
  Administered 2016-05-28: 1 mg via INTRAVENOUS
  Filled 2016-05-28: qty 1

## 2016-05-28 MED ORDER — ACETAMINOPHEN 650 MG RE SUPP: 650.0000 mg | Freq: Four times a day (QID) | RECTAL | Status: DC | PRN

## 2016-05-28 MED ORDER — METOCLOPRAMIDE HCL 5 MG/ML IJ SOLN: 5.0000 mg | Freq: Three times a day (TID) | INTRAMUSCULAR | Status: DC | PRN

## 2016-05-28 MED ORDER — ACETAMINOPHEN 500 MG PO TABS
1000.0000 mg | ORAL_TABLET | Freq: Once | ORAL | Status: AC
  Administered 2016-05-28: 1000 mg via ORAL

## 2016-05-28 MED ORDER — ATENOLOL 50 MG PO TABS
100.0000 mg | ORAL_TABLET | Freq: Every day | ORAL | Status: DC
  Administered 2016-05-29: 100 mg via ORAL
  Filled 2016-05-28: qty 2

## 2016-05-28 MED ORDER — SODIUM CHLORIDE 0.9 % IR SOLN
Status: DC | PRN
  Administered 2016-05-28: 3000 mL

## 2016-05-28 MED ORDER — ACETAMINOPHEN 500 MG PO TABS
ORAL_TABLET | ORAL | Status: AC
  Filled 2016-05-28: qty 2

## 2016-05-28 MED ORDER — LACTATED RINGERS IV SOLN
INTRAVENOUS | Status: DC | PRN
  Administered 2016-05-28 (×2): via INTRAVENOUS

## 2016-05-28 MED ORDER — BISACODYL 5 MG PO TBEC: 5.0000 mg | DELAYED_RELEASE_TABLET | Freq: Every day | ORAL | Status: DC | PRN

## 2016-05-28 MED ORDER — LIDOCAINE HCL (CARDIAC) 20 MG/ML IV SOLN
INTRAVENOUS | Status: DC | PRN
  Administered 2016-05-28: 60 mg via INTRATRACHEAL

## 2016-05-28 MED ORDER — DEXAMETHASONE SODIUM PHOSPHATE 10 MG/ML IJ SOLN
INTRAMUSCULAR | Status: DC | PRN
  Administered 2016-05-28: 5 mg via INTRAVENOUS

## 2016-05-28 MED ORDER — ONDANSETRON HCL 4 MG PO TABS: 4.0000 mg | ORAL_TABLET | Freq: Four times a day (QID) | ORAL | Status: DC | PRN

## 2016-05-28 MED ORDER — ASPIRIN EC 325 MG PO TBEC
325.0000 mg | DELAYED_RELEASE_TABLET | Freq: Two times a day (BID) | ORAL | Status: DC
  Administered 2016-05-28 – 2016-05-29 (×3): 325 mg via ORAL
  Filled 2016-05-28 (×3): qty 1

## 2016-05-28 MED ORDER — ACETAMINOPHEN 325 MG PO TABS: 650.0000 mg | ORAL_TABLET | Freq: Four times a day (QID) | ORAL | Status: DC | PRN

## 2016-05-28 MED ORDER — FLEET ENEMA 7-19 GM/118ML RE ENEM: 1.0000 | ENEMA | Freq: Once | RECTAL | Status: DC | PRN

## 2016-05-28 MED ORDER — SODIUM CHLORIDE 0.9 % IV SOLN: INTRAVENOUS | Status: DC

## 2016-05-28 MED ORDER — 0.9 % SODIUM CHLORIDE (POUR BTL) OPTIME
TOPICAL | Status: DC | PRN
  Administered 2016-05-28: 1000 mL

## 2016-05-28 MED ORDER — CEFAZOLIN IN D5W 1 GM/50ML IV SOLN
1.0000 g | Freq: Four times a day (QID) | INTRAVENOUS | Status: AC
  Administered 2016-05-28 (×2): 1 g via INTRAVENOUS
  Filled 2016-05-28 (×2): qty 50

## 2016-05-28 MED ORDER — CEFAZOLIN SODIUM-DEXTROSE 2-4 GM/100ML-% IV SOLN
INTRAVENOUS | Status: AC
  Filled 2016-05-28: qty 100

## 2016-05-28 MED ORDER — TRANEXAMIC ACID 1000 MG/10ML IV SOLN
1000.0000 mg | Freq: Once | INTRAVENOUS | Status: AC
  Administered 2016-05-28: 1000 mg via INTRAVENOUS
  Filled 2016-05-28 (×2): qty 10

## 2016-05-28 MED ORDER — DEXAMETHASONE SODIUM PHOSPHATE 10 MG/ML IJ SOLN
10.0000 mg | Freq: Once | INTRAMUSCULAR | Status: AC
  Administered 2016-05-29: 10 mg via INTRAVENOUS
  Filled 2016-05-28: qty 1

## 2016-05-28 MED ORDER — GABAPENTIN 300 MG PO CAPS
300.0000 mg | ORAL_CAPSULE | Freq: Three times a day (TID) | ORAL | Status: DC
  Administered 2016-05-28 – 2016-05-29 (×4): 300 mg via ORAL
  Filled 2016-05-28 (×4): qty 1

## 2016-05-28 MED ORDER — BUPIVACAINE LIPOSOME 1.3 % IJ SUSP
INTRAMUSCULAR | Status: DC | PRN
  Administered 2016-05-28: 20 mL

## 2016-05-28 MED ORDER — PROPOFOL 10 MG/ML IV BOLUS
INTRAVENOUS | Status: DC | PRN
  Administered 2016-05-28: 150 mg via INTRAVENOUS

## 2016-05-28 MED ORDER — DOCUSATE SODIUM 100 MG PO CAPS
100.0000 mg | ORAL_CAPSULE | Freq: Two times a day (BID) | ORAL | Status: DC
  Administered 2016-05-28 – 2016-05-29 (×3): 100 mg via ORAL
  Filled 2016-05-28 (×3): qty 1

## 2016-05-28 MED ORDER — ALUM & MAG HYDROXIDE-SIMETH 200-200-20 MG/5ML PO SUSP: 30.0000 mL | ORAL | Status: DC | PRN

## 2016-05-28 MED ORDER — CHLORHEXIDINE GLUCONATE 4 % EX LIQD: 60.0000 mL | Freq: Once | CUTANEOUS | Status: DC

## 2016-05-28 MED ORDER — ONDANSETRON HCL 4 MG/2ML IJ SOLN: 4.0000 mg | Freq: Once | INTRAMUSCULAR | Status: DC | PRN

## 2016-05-28 MED ORDER — MENTHOL 3 MG MT LOZG: 1.0000 | LOZENGE | OROMUCOSAL | Status: DC | PRN

## 2016-05-28 MED ORDER — BUPIVACAINE-EPINEPHRINE (PF) 0.25% -1:200000 IJ SOLN
INTRAMUSCULAR | Status: AC
  Filled 2016-05-28: qty 30

## 2016-05-28 MED ORDER — MIDAZOLAM HCL 2 MG/2ML IJ SOLN
INTRAMUSCULAR | Status: AC
  Filled 2016-05-28: qty 2

## 2016-05-28 MED ORDER — LISINOPRIL 20 MG PO TABS
20.0000 mg | ORAL_TABLET | Freq: Every day | ORAL | Status: DC
Start: 2016-05-29 — End: 2016-05-29
  Administered 2016-05-29: 20 mg via ORAL
  Filled 2016-05-28: qty 1

## 2016-05-28 MED ORDER — PROPOFOL 10 MG/ML IV BOLUS
INTRAVENOUS | Status: AC
  Filled 2016-05-28: qty 20

## 2016-05-28 MED ORDER — SODIUM CHLORIDE 0.9 % IJ SOLN
INTRAMUSCULAR | Status: DC | PRN
  Administered 2016-05-28: 20 mL via INTRAVENOUS

## 2016-05-28 MED ORDER — SENNOSIDES-DOCUSATE SODIUM 8.6-50 MG PO TABS: 1.0000 | ORAL_TABLET | Freq: Every evening | ORAL | Status: DC | PRN

## 2016-05-28 MED ORDER — ONDANSETRON HCL 4 MG/2ML IJ SOLN: 4.0000 mg | Freq: Four times a day (QID) | INTRAMUSCULAR | Status: DC | PRN

## 2016-05-28 MED ORDER — HYDROCODONE-ACETAMINOPHEN 7.5-325 MG PO TABS: 1.0000 | ORAL_TABLET | Freq: Once | ORAL | Status: DC | PRN

## 2016-05-28 MED ORDER — METOCLOPRAMIDE HCL 5 MG PO TABS: 5.0000 mg | ORAL_TABLET | Freq: Three times a day (TID) | ORAL | Status: DC | PRN

## 2016-05-28 MED ORDER — ZOLPIDEM TARTRATE 5 MG PO TABS: 5.0000 mg | ORAL_TABLET | Freq: Every evening | ORAL | Status: DC | PRN

## 2016-05-28 MED ORDER — PHENYLEPHRINE HCL 10 MG/ML IJ SOLN
INTRAMUSCULAR | Status: DC | PRN
  Administered 2016-05-28 (×5): 80 ug via INTRAVENOUS

## 2016-05-28 MED ORDER — FENTANYL CITRATE (PF) 250 MCG/5ML IJ SOLN
INTRAMUSCULAR | Status: DC | PRN
  Administered 2016-05-28: 50 ug via INTRAVENOUS
  Administered 2016-05-28: 25 ug via INTRAVENOUS
  Administered 2016-05-28: 50 ug via INTRAVENOUS
  Administered 2016-05-28: 100 ug via INTRAVENOUS

## 2016-05-28 MED ORDER — MIDAZOLAM HCL 5 MG/5ML IJ SOLN
INTRAMUSCULAR | Status: DC | PRN
  Administered 2016-05-28: 2 mg via INTRAVENOUS

## 2016-05-28 MED ORDER — ROPIVACAINE HCL 5 MG/ML IJ SOLN
INTRAMUSCULAR | Status: DC | PRN
  Administered 2016-05-28: 30 mL via PERINEURAL

## 2016-05-28 MED ORDER — BUPIVACAINE-EPINEPHRINE 0.5% -1:200000 IJ SOLN
INTRAMUSCULAR | Status: DC | PRN
  Administered 2016-05-28: 30 mL

## 2016-05-28 MED ORDER — BUPIVACAINE LIPOSOME 1.3 % IJ SUSP
20.0000 mL | Freq: Once | INTRAMUSCULAR | Status: DC
  Filled 2016-05-28: qty 20

## 2016-05-28 MED ORDER — ACETAMINOPHEN 500 MG PO TABS
ORAL_TABLET | ORAL | Status: AC
  Filled 2016-05-28: qty 1

## 2016-05-28 MED ORDER — CELECOXIB 200 MG PO CAPS
200.0000 mg | ORAL_CAPSULE | Freq: Two times a day (BID) | ORAL | Status: DC
  Administered 2016-05-28 – 2016-05-29 (×3): 200 mg via ORAL
  Filled 2016-05-28 (×3): qty 1

## 2016-05-28 MED ORDER — PHENOL 1.4 % MT LIQD: 1.0000 | OROMUCOSAL | Status: DC | PRN

## 2016-05-28 MED ORDER — OXYCODONE HCL 5 MG PO TABS
5.0000 mg | ORAL_TABLET | ORAL | Status: DC | PRN
  Administered 2016-05-28: 5 mg via ORAL
  Administered 2016-05-29 (×2): 10 mg via ORAL
  Filled 2016-05-28: qty 2
  Filled 2016-05-28: qty 1
  Filled 2016-05-28: qty 2

## 2016-05-28 SURGICAL SUPPLY — 57 items
BANDAGE ESMARK 6X9 LF (GAUZE/BANDAGES/DRESSINGS) ×1 IMPLANT
BLADE SAGITTAL 13X1.27X60 (BLADE) ×2 IMPLANT
BLADE SAW SGTL 83.5X18.5 (BLADE) ×2 IMPLANT
BLADE SURG 10 STRL SS (BLADE) IMPLANT
BNDG ESMARK 6X9 LF (GAUZE/BANDAGES/DRESSINGS) ×2
BOWL SMART MIX CTS (DISPOSABLE) ×2 IMPLANT
CAPT KNEE TOTAL 3 ×2 IMPLANT
CEMENT BONE SIMPLEX SPEEDSET (Cement) ×4 IMPLANT
CLSR STERI-STRIP ANTIMIC 1/2X4 (GAUZE/BANDAGES/DRESSINGS) ×2 IMPLANT
COVER SURGICAL LIGHT HANDLE (MISCELLANEOUS) ×2 IMPLANT
CUFF TOURNIQUET SINGLE 34IN LL (TOURNIQUET CUFF) ×2 IMPLANT
DRAPE EXTREMITY T 121X128X90 (DRAPE) ×2 IMPLANT
DRAPE INCISE IOBAN 66X45 STRL (DRAPES) ×4 IMPLANT
DRAPE PROXIMA HALF (DRAPES) IMPLANT
DRAPE U-SHAPE 47X51 STRL (DRAPES) ×2 IMPLANT
DRSG AQUACEL AG ADV 3.5X10 (GAUZE/BANDAGES/DRESSINGS) ×2 IMPLANT
DRSG PAD ABDOMINAL 8X10 ST (GAUZE/BANDAGES/DRESSINGS) IMPLANT
DURAPREP 26ML APPLICATOR (WOUND CARE) ×2 IMPLANT
ELECT REM PT RETURN 9FT ADLT (ELECTROSURGICAL) ×2
ELECTRODE REM PT RTRN 9FT ADLT (ELECTROSURGICAL) ×1 IMPLANT
GLOVE BIOGEL M 7.0 STRL (GLOVE) IMPLANT
GLOVE BIOGEL PI IND STRL 7.5 (GLOVE) IMPLANT
GLOVE BIOGEL PI IND STRL 8.5 (GLOVE) ×5 IMPLANT
GLOVE BIOGEL PI INDICATOR 7.5 (GLOVE)
GLOVE BIOGEL PI INDICATOR 8.5 (GLOVE) ×5
GLOVE SURG ORTHO 8.0 STRL STRW (GLOVE) ×12 IMPLANT
GOWN STRL REUS W/ TWL LRG LVL3 (GOWN DISPOSABLE) ×1 IMPLANT
GOWN STRL REUS W/ TWL XL LVL3 (GOWN DISPOSABLE) ×2 IMPLANT
GOWN STRL REUS W/TWL 2XL LVL3 (GOWN DISPOSABLE) ×2 IMPLANT
GOWN STRL REUS W/TWL LRG LVL3 (GOWN DISPOSABLE) ×1
GOWN STRL REUS W/TWL XL LVL3 (GOWN DISPOSABLE) ×2
HANDPIECE INTERPULSE COAX TIP (DISPOSABLE) ×1
HOOD PEEL AWAY FACE SHEILD DIS (HOOD) ×6 IMPLANT
KIT BASIN OR (CUSTOM PROCEDURE TRAY) ×2 IMPLANT
KIT ROOM TURNOVER OR (KITS) ×2 IMPLANT
KNEE CAPITATED TOTAL 3 ×1 IMPLANT
MANIFOLD NEPTUNE II (INSTRUMENTS) ×2 IMPLANT
NEEDLE 22X1 1/2 (OR ONLY) (NEEDLE) ×4 IMPLANT
NS IRRIG 1000ML POUR BTL (IV SOLUTION) ×2 IMPLANT
PACK TOTAL JOINT (CUSTOM PROCEDURE TRAY) ×2 IMPLANT
PACK UNIVERSAL I (CUSTOM PROCEDURE TRAY) IMPLANT
PAD ARMBOARD 7.5X6 YLW CONV (MISCELLANEOUS) ×4 IMPLANT
SET HNDPC FAN SPRY TIP SCT (DISPOSABLE) ×1 IMPLANT
STAPLER VISISTAT 35W (STAPLE) ×2 IMPLANT
STRIP CLOSURE SKIN 1/2X4 (GAUZE/BANDAGES/DRESSINGS) ×2 IMPLANT
SUCTION FRAZIER HANDLE 10FR (MISCELLANEOUS) ×1
SUCTION TUBE FRAZIER 10FR DISP (MISCELLANEOUS) ×1 IMPLANT
SUT BONE WAX W31G (SUTURE) ×2 IMPLANT
SUT VIC AB 0 CTB1 27 (SUTURE) ×4 IMPLANT
SUT VIC AB 1 CT1 27 (SUTURE) ×2
SUT VIC AB 1 CT1 27XBRD ANBCTR (SUTURE) ×2 IMPLANT
SUT VIC AB 2-0 CT1 27 (SUTURE) ×2
SUT VIC AB 2-0 CT1 TAPERPNT 27 (SUTURE) ×2 IMPLANT
SYR 20CC LL (SYRINGE) ×4 IMPLANT
TOWEL OR 17X24 6PK STRL BLUE (TOWEL DISPOSABLE) ×2 IMPLANT
TOWEL OR 17X26 10 PK STRL BLUE (TOWEL DISPOSABLE) ×2 IMPLANT
WATER STERILE IRR 1000ML POUR (IV SOLUTION) IMPLANT

## 2016-05-28 NOTE — Transfer of Care (Signed)
Immediate Anesthesia Transfer of Care Note  Patient: Savannah Reese  Procedure(s) Performed: Procedure(s): TOTAL KNEE ARTHROPLASTY (Left)  Patient Location: PACU  Anesthesia Type:GA combined with regional for post-op pain  Level of Consciousness: awake, alert  and oriented  Airway & Oxygen Therapy: Patient Spontanous Breathing and Patient connected to nasal cannula oxygen  Post-op Assessment: Report given to RN, Post -op Vital signs reviewed and stable and Patient moving all extremities X 4  Post vital signs: Reviewed and stable  Last Vitals:  Vitals:   05/28/16 0604 05/28/16 0917  BP: (!) 150/79 127/83  Pulse: 92 90  Resp: 20 13  Temp: 37 C (!) 35.9 C    Last Pain:  Vitals:   05/28/16 0604  TempSrc: Oral  PainSc: 7       Patients Stated Pain Goal: 5 (05/28/16 0604)  Complications: No apparent anesthesia complications

## 2016-05-28 NOTE — H&P (Signed)
Savannah Reese MRN:  [id number] DOB/SEX:  02/10/1956/female  CHIEF COMPLAINT:  Painful left Knee  HISTORY: Patient is a 60 y.o. female presented with a history of pain in the left knee. Onset of symptoms was gradual starting a few years ago with gradually worsening course since that time. Patient has been treated conservatively with over-the-counter NSAIDs and activity modification. Patient currently rates pain in the knee at 10 out of 10 with activity. There is pain at night.  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   05/28/2016 at 0600   lisinopril (PRINIVIL,ZESTRIL) 20 MG tablet Take 20 mg by mouth daily.   05/28/2016 at 0415   aspirin EC 325 MG tablet Take 1 tablet (325 mg total) by mouth daily. 30 tablet 0 Past Month at Unknown time   methocarbamol (ROBAXIN) 500 MG tablet Take 1-2 tablets (500-1,000 mg total) by mouth every 6 (six) hours as needed for muscle spasms. 60 tablet 2 Past Month at Unknown time   oxyCODONE (OXY IR/ROXICODONE) 5 MG immediate release tablet Take 1-2 tablets (5-10 mg total) by mouth every 4 (four) hours as needed for breakthrough pain. 90 tablet 0 Past Month at Unknown time   OxyCODONE (OXYCONTIN) 10 mg T12A 12 hr tablet Take 1 tablet (10 mg total) by mouth every 12 (twelve) hours. 30 tablet 0 Past Month at Unknown time    ALLERGIES:  Not on File  REVIEW OF SYSTEMS:  A comprehensive review of systems was negative except  for: Musculoskeletal: positive for arthralgias and stiff joints   FAMILY HISTORY:  History reviewed. No pertinent family history.  SOCIAL HISTORY:   Social History  Substance Use Topics   Smoking status: Never Smoker   Smokeless tobacco: Never Used   Alcohol use No     EXAMINATION:  Vital signs in last 24 hours: Temp:  [98.6 F (37 C)] 98.6 F (37 C) (09/25 0604) Pulse Rate:  [92] 92 (09/25 0604) Resp:  [20] 20 (09/25 0604) BP: (150)/(79) 150/79 (09/25 0604) SpO2:  [100 %] 100 % (09/25 0604) Weight:  [94 kg (207 lb 3.2 oz)] 94 kg (207 lb 3.2 oz) (09/25 0604)  BP (!) 150/79    Pulse 92    Temp 98.6 F (37 C) (Oral)    Resp 20    Ht 5' 6.5" (1.689 m)    Wt 94 kg (207 lb 3.2 oz)    SpO2 100%    BMI 32.94 kg/m   General Appearance:    Alert, cooperative, no distress, appears stated age  Head:    Normocephalic, without obvious abnormality, atraumatic  Eyes:    PERRL, conjunctiva/corneas clear, EOM's intact, fundi    benign, both eyes  Ears:    Normal TM's and external ear canals, both ears  Nose:   Nares normal, septum midline, mucosa normal, no drainage    or sinus tenderness  Throat:   Lips, mucosa, and tongue normal; teeth and  gums normal  Neck:   Supple, symmetrical, trachea midline, no adenopathy;    thyroid:  no enlargement/tenderness/nodules; no carotid   bruit or JVD  Back:     Symmetric, no curvature, ROM normal, no CVA tenderness  Lungs:     Clear to auscultation bilaterally, respirations unlabored  Chest Wall:    No tenderness or deformity   Heart:    Regular rate and rhythm, S1 and S2 normal, no murmur, rub   or gallop  Breast Exam:    No tenderness, masses, or nipple abnormality  Abdomen:     Soft, non-tender, bowel sounds active all four quadrants,    no masses, no organomegaly  Genitalia:    Normal female without lesion, discharge or tenderness  Rectal:    Normal tone, no masses or tenderness;   guaiac negative stool  Extremities:   Extremities normal,  atraumatic, no cyanosis or edema  Pulses:   2+ and symmetric all extremities  Skin:   Skin color, texture, turgor normal, no rashes or lesions  Lymph nodes:   Cervical, supraclavicular, and axillary nodes normal  Neurologic:   CNII-XII intact, normal strength, sensation and reflexes    throughout     Musculoskeletal:  ROM 0-120, Ligaments intact,  Imaging Review Plain radiographs demonstrate severe degenerative joint disease of the left knee. The overall alignment is neutral. The bone quality appears to be excellent for age and reported activity level.  Assessment/Plan: Primary osteoarthritis, left knee   The patient history, physical examination and imaging studies are consistent with advanced degenerative joint disease of the left knee. The patient has failed conservative treatment.  The clearance notes were reviewed.  After discussion with the patient it was felt that Total Knee Replacement was indicated. The procedure,  risks, and benefits of total knee arthroplasty were presented and reviewed. The risks including but not limited to aseptic loosening, infection, blood clots, vascular injury, stiffness, patella tracking problems complications among others were discussed. The patient acknowledged the explanation, agreed to proceed with the plan. Guy Sandifer 05/28/2016, 6:26 AM

## 2016-05-28 NOTE — Anesthesia Procedure Notes (Signed)
Procedure Name: LMA Insertion Date/Time: 05/28/2016 7:35 AM Performed by: Marena ChancyBECKNER, Byanca Kasper S Pre-anesthesia Checklist: Patient identified, Emergency Drugs available, Suction available and Patient being monitored Patient Re-evaluated:Patient Re-evaluated prior to inductionOxygen Delivery Method: Circle System Utilized Preoxygenation: Pre-oxygenation with 100% oxygen Intubation Type: IV induction Ventilation: Mask ventilation without difficulty LMA: LMA inserted LMA Size: 4.0 Number of attempts: 1 Airway Equipment and Method: Bite block Placement Confirmation: positive ETCO2 Tube secured with: Tape Dental Injury: Teeth and Oropharynx as per pre-operative assessment

## 2016-05-28 NOTE — Progress Notes (Signed)
Orthopedic Tech Progress Note Patient Details:  Savannah Reese 09/24/1955 [id number] Ortho visit put on cpm at 1850 Patient ID: Savannah Reese, female   DOB: 04/20/1956, 60 y.o.   MRN: [id number]   Savannah Reese, Savannah Reese 05/28/2016, 6:50 PM

## 2016-05-28 NOTE — Progress Notes (Signed)
Patient arrived to floor. Alert, verbal. No complaints of pain or discomfort at this time. CPM on left leg, set 0-90. Bowel sounds hypoactive. LR infusing 75ml, left AC.  Incentive spirometer encouraged.

## 2016-05-28 NOTE — Anesthesia Procedure Notes (Signed)
Anesthesia Regional Block:  Adductor canal block  Pre-Anesthetic Checklist: ,, timeout performed, Correct Patient, Correct Site, Correct Laterality, Correct Procedure, Correct Position, site marked, Risks and benefits discussed,  Surgical consent,  Pre-op evaluation,  At surgeon's request and post-op pain management  Laterality: Left  Prep: chloraprep       Needles:   Needle Type: Echogenic Stimulator Needle     Needle Length: 5cm 5 cm Needle Gauge: 21 and 21 G    Additional Needles:  Procedures: ultrasound guided (picture in chart) Adductor canal block Narrative:  Injection made incrementally with aspirations every 5 mL.  Performed by: Personally  Anesthesiologist: Merranda Bolls  Additional Notes: No issues with block

## 2016-05-28 NOTE — Anesthesia Postprocedure Evaluation (Signed)
Anesthesia Post Note  Patient: Savannah Reese  Procedure(s) Performed: Procedure(s) (LRB): TOTAL KNEE ARTHROPLASTY (Left)  Patient location during evaluation: PACU Anesthesia Type: General and Regional Level of consciousness: awake and alert Pain management: pain level controlled Vital Signs Assessment: post-procedure vital signs reviewed and stable Respiratory status: spontaneous breathing, nonlabored ventilation, respiratory function stable and patient connected to nasal cannula oxygen Cardiovascular status: blood pressure returned to baseline and stable Postop Assessment: no signs of nausea or vomiting Anesthetic complications: no    Last Vitals:  Vitals:   05/28/16 0930 05/28/16 0945  BP: 130/79 137/82  Pulse: 84 89  Resp: 12 13  Temp:      Last Pain:  Vitals:   05/28/16 0945  TempSrc:   PainSc: 2                  Reino KentJudd, Elnora Quizon J

## 2016-05-28 NOTE — Progress Notes (Signed)
Orthopedic Tech Progress Note Patient Details:  Savannah CoachMaimouna Reese 05/23/1956 [id number]  CPM Left Knee CPM Left Knee: On Left Knee Flexion (Degrees): 90 Left Knee Extension (Degrees): 0 Additional Comments: trapeze bar patient helper   Nikki DomCrawford, Raelan Burgoon 05/28/2016, 9:52 AM Viewed order from doctor's order list

## 2016-05-29 ENCOUNTER — Encounter (HOSPITAL_COMMUNITY): Payer: Self-pay | Admitting: Orthopedic Surgery

## 2016-05-29 LAB — CBC
HEMATOCRIT: 35.7 % — AB (ref 36.0–46.0)
HEMOGLOBIN: 11.4 g/dL — AB (ref 12.0–15.0)
MCH: 28.4 pg (ref 26.0–34.0)
MCHC: 31.9 g/dL (ref 30.0–36.0)
MCV: 89 fL (ref 78.0–100.0)
Platelets: 203 10*3/uL (ref 150–400)
RBC: 4.01 MIL/uL (ref 3.87–5.11)
RDW: 14 % (ref 11.5–15.5)
WBC: 10.9 10*3/uL — ABNORMAL HIGH (ref 4.0–10.5)

## 2016-05-29 LAB — BASIC METABOLIC PANEL
Anion gap: 9 (ref 5–15)
BUN: 22 mg/dL — ABNORMAL HIGH (ref 6–20)
CHLORIDE: 107 mmol/L (ref 101–111)
CO2: 22 mmol/L (ref 22–32)
CREATININE: 0.87 mg/dL (ref 0.44–1.00)
Calcium: 8.6 mg/dL — ABNORMAL LOW (ref 8.9–10.3)
GFR calc non Af Amer: >60 mL/min (ref >60)
Glucose, Bld: 123 mg/dL — ABNORMAL HIGH (ref 65–99)
POTASSIUM: 4.1 mmol/L (ref 3.5–5.1)
Sodium: 138 mmol/L (ref 135–145)

## 2016-05-29 MED ORDER — INFLUENZA VAC SPLIT QUAD 0.5 ML IM SUSY
0.5000 mL | PREFILLED_SYRINGE | INTRAMUSCULAR | Status: AC
  Administered 2016-05-29: 0.5 mL via INTRAMUSCULAR

## 2016-05-29 MED ORDER — METHOCARBAMOL 500 MG PO TABS: 500.0000 mg | ORAL_TABLET | Freq: Four times a day (QID) | ORAL | 0 refills | Status: DC | PRN

## 2016-05-29 MED ORDER — ASPIRIN 325 MG PO TBEC: 325.0000 mg | DELAYED_RELEASE_TABLET | Freq: Two times a day (BID) | ORAL | 0 refills | Status: DC

## 2016-05-29 MED ORDER — OXYCODONE HCL 5 MG PO TABS: 5.0000 mg | ORAL_TABLET | ORAL | 0 refills | Status: DC | PRN

## 2016-05-29 NOTE — Evaluation (Signed)
Physical Therapy Evaluation Patient Details Name: Savannah Reese MRN: [id number] DOB: 17-Aug-1956 Today's Date: 05/29/2016   History of Present Illness  pt presents with L TKR.  pt with hx of HTN and R TKR.    Clinical Impression  Pt moving well and seems very motivated.  Son present to interpret for pt and indicates he and other family members will be available to A pt at D/C.  Feel pt will make great progress to return to home with family.  Will continue to follow.      Follow Up Recommendations Home health PT;Supervision/Assistance - 24 hour    Equipment Recommendations  3in1 (PT)    Recommendations for Other Services       Precautions / Restrictions Precautions Precautions: Fall Restrictions Weight Bearing Restrictions: Yes LLE Weight Bearing: Weight bearing as tolerated      Mobility  Bed Mobility Overal bed mobility: Needs Assistance Bed Mobility: Supine to Sit     Supine to sit: Min guard     General bed mobility comments: pt tries to do as much as she can.  Increased time needed and utilizes UEs to A with bringing L LE to EOB.    Transfers Overall transfer level: Needs assistance Equipment used: Rolling walker (2 wheeled) Transfers: Sit to/from Stand Sit to Stand: Min guard         General transfer comment: cues for UE use and positioning of LEs.    Ambulation/Gait Ambulation/Gait assistance: Min guard Ambulation Distance (Feet): 100 Feet (x2) Assistive device: Rolling walker (2 wheeled) Gait Pattern/deviations: Step-through pattern;Decreased dorsiflexion - right     General Gait Details: pt moving well despite pain and R foot drop.  pt needed seated rest rbeak in between bouts of gait.    Stairs            Wheelchair Mobility    Modified Rankin (Stroke Patients Only)       Balance Overall balance assessment: Needs assistance Sitting-balance support: No upper extremity supported;Feet supported Sitting balance-Leahy Scale: Good      Standing balance support: Bilateral upper extremity supported;During functional activity Standing balance-Leahy Scale: Poor                               Pertinent Vitals/Pain Pain Assessment: 0-10 Pain Score: 5  Pain Location: pt indicates pain as "medium" when asked in L knee. Pain Descriptors / Indicators: Grimacing;Guarding Pain Intervention(s): Monitored during session;Premedicated before session;Repositioned    Home Living Family/patient expects to be discharged to:: Private residence Living Arrangements: Children Available Help at Discharge: Family;Available 24 hours/day Type of Home: House Home Access: Level entry     Home Layout: One level Home Equipment: Walker - 2 wheels Additional Comments: Home set-up per son.    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  Sensation intact, but ROM and strength limited in ankle. LLE Deficits / Details: Sensation intact, AAROM ~10 - 80, strength limited post-op.  Cervical / Trunk Assessment: Normal  Communication   Communication: Prefers language other than English (Son interprets)  Cognition Arousal/Alertness: Awake/alert Behavior During Therapy:  WFL for tasks assessed/performed Overall Cognitive Status: Within Functional Limits for tasks assessed                      General Comments      Exercises Total Joint Exercises Ankle Circles/Pumps: AROM;Both;10 reps Quad Sets: AROM;Both;10 reps Hip ABduction/ADduction: AROM;Left;10 reps Long Arc Quad: AROM;Left;10 reps Knee Flexion: AROM;Left;10 reps   Assessment/Plan    PT Assessment Patient needs continued PT services  PT Problem List Decreased strength;Decreased  range of motion;Decreased activity tolerance;Decreased balance;Decreased mobility;Decreased coordination;Decreased knowledge of use of DME;Pain          PT Treatment Interventions DME instruction;Gait training;Stair training;Functional mobility training;Therapeutic activities;Therapeutic exercise;Balance training;Patient/family education    PT Goals (Current goals can be found in the Care Plan section)  Acute Rehab PT Goals Patient Stated Goal: Return to home PT Goal Formulation: With patient Time For Goal Achievement: 06/05/16 Potential to Achieve Goals: Good    Frequency 7X/week   Barriers to discharge        Co-evaluation               End of Session Equipment Utilized During Treatment: Gait belt Activity Tolerance: Patient tolerated treatment well Patient left: in chair;with call bell/phone within reach;with family/visitor present Nurse Communication: Mobility status         Time: 1006-1046 PT Time Calculation (min) (ACUTE ONLY): 40 min   Charges:   PT Evaluation $PT Eval Moderate Complexity: 1 Procedure PT Treatments $Gait Training: 8-22 mins $Therapeutic Exercise: 8-22 mins   PT G CodesSunny Schlein:        Joeline Freer F, [place]PT [phone] 05/29/2016, 2:55 PM

## 2016-05-29 NOTE — Op Note (Signed)
TOTAL KNEE REPLACEMENT OPERATIVE NOTE:  05/28/2016  5:36 PM  PATIENT:  Savannah Reese  60 y.o. female  PRE-OPERATIVE DIAGNOSIS:  primary osteoarthritis left knee  POST-OPERATIVE DIAGNOSIS:  primary osteoarthritis left knee  PROCEDURE:  Procedure(s): TOTAL KNEE ARTHROPLASTY  SURGEON:  Surgeon(s): Dannielle HuhSteve Chantee Cerino, MD  PHYSICIAN ASSISTANT: Laurier Nancy{Colby Robbins, PAC   ANESTHESIA:   spinal  DRAINS: Hemovac  SPECIMEN: None  COUNTS:  Correct  TOURNIQUET:   Total Tourniquet Time Documented: Thigh (Left) - 44 minutes Total: Thigh (Left) - 44 minutes   DICTATION:  Indication for procedure:    The patient is a 60 y.o. female who has failed conservative treatment for primary osteoarthritis left knee.  Informed consent was obtained prior to anesthesia. The risks versus benefits of the operation were explain and in a way the patient can, and did, understand.   On the implant demand matching protocol, this patient scored 10.  Therefore, this patient did" "did not receive a polyethylene insert with vitamin E which is a high demand implant.  Description of procedure:     The patient was taken to the operating room and placed under anesthesia.  The patient was positioned in the usual fashion taking care that all body parts were adequately padded and/or protected.  I foley catheter was not placed.  A tourniquet was applied and the leg prepped and draped in the usual sterile fashion.  The extremity was exsanguinated with the esmarch and tourniquet inflated to 350 mmHg.  Pre-operative range of motion was normal.  The knee was in 5 degree of mild varus.  A midline incision approximately 6-7 inches long was made with a #10 blade.  A new blade was used to make a parapatellar arthrotomy going 2-3 cm into the quadriceps tendon, over the patella, and alongside the medial aspect of the patellar tendon.  A synovectomy was then performed with the #10 blade and forceps. I then elevated the deep MCL off the  medial tibial metaphysis subperiosteally around to the semimembranosus attachment.    I everted the patella and used calipers to measure patellar thickness.  I used the reamer to ream down to appropriate thickness to recreate the native thickness.  I then removed excess bone with the rongeur and sagittal saw.  I used the appropriately sized template and drilled the three lug holes.  I then put the trial in place and measured the thickness with the calipers to ensure recreation of the native thickness.  The trial was then removed and the patella subluxed and the knee brought into flexion.  A homan retractor was place to retract and protect the patella and lateral structures.  A Z-retractor was place medially to protect the medial structures.  The extra-medullary alignment system was used to make cut the tibial articular surface perpendicular to the anamotic axis of the tibia and in 3 degrees of posterior slope.  The cut surface and alignment jig was removed.  I then used the intramedullary alignment guide to make a 6 valgus cut on the distal femur.  I then marked out the epicondylar axis on the distal femur.  The posterior condylar axis measured 3 degrees.  I then used the anterior referencing sizer and measured the femur to be a size 8.  The 4-In-1 cutting block was screwed into place in external rotation matching the posterior condylar angle, making our cuts perpendicular to the epicondylar axis.  Anterior, posterior and chamfer cuts were made with the sagittal saw.  The cutting block and cut pieces  were removed.  A lamina spreader was placed in 90 degrees of flexion.  The ACL, PCL, menisci, and posterior condylar osteophytes were removed.  A 12 mm spacer blocked was found to offer good flexion and extension gap balance after minimal in degree releasing.   The scoop retractor was then placed and the femoral finishing block was pinned in place.  The small sagittal saw was used as well as the lug drill to  finish the femur.  The block and cut surfaces were removed and the medullary canal hole filled with autograft bone from the cut pieces.  The tibia was delivered forward in deep flexion and external rotation.  A size E tray was selected and pinned into place centered on the medial 1/3 of the tibial tubercle.  The reamer and keel was used to prepare the tibia through the tray.    I then trialed with the size 8 femur, size E tibia, a 12 mm insert and the 32 patella.  I had excellent flexion/extension gap balance, excellent patella tracking.  Flexion was full and beyond 120 degrees; extension was zero.  These components were chosen and the staff opened them to me on the back table while the knee was lavaged copiously and the cement mixed.  The soft tissue was infiltrated with 60cc of exparel 1.3% through a 21 gauge needle.  I cemented in the components and removed all excess cement.  The polyethylene tibial component was snapped into place and the knee placed in extension while cement was hardening.  The capsule was infilltrated with 30cc of .25% Marcaine with epinephrine.  A hemovac was place in the joint exiting superolaterally.  A pain pump was place superomedially superficial to the arthrotomy.  Once the cement was hard, the tourniquet was let down.  Hemostasis was obtained.  The arthrotomy was closed with figure-8 #1 vicryl sutures.  The deep soft tissues were closed with #0 vicryls and the subcuticular layer closed with a running #2-0 vicryl.  The skin was reapproximated and closed with skin staples.  The wound was dressed with xeroform, 4 x4's, 2 ABD sponges, a single layer of webril and a TED stocking.   The patient was then awakened, extubated, and taken to the recovery room in stable condition.  BLOOD LOSS:  300cc DRAINS: 1 hemovac, 1 pain catheter COMPLICATIONS:  None.  PLAN OF CARE: Admit to inpatient   PATIENT DISPOSITION:  PACU - hemodynamically stable.   Delay start of Pharmacological  VTE agent (>24hrs) due to surgical blood loss or risk of bleeding:  not applicable  Please fax a copy of this op note to my office at [phone] (please only include page 1 and 2 of the Case Information op note)

## 2016-05-29 NOTE — Progress Notes (Signed)
Discharged patient to home, discharge instructions given to patients son and he translated to patient, RN answered patient and sons questions, they stated they understood.

## 2016-05-29 NOTE — Progress Notes (Signed)
Dr Sherlean FootLucey in to see Patient, ok for discharge

## 2016-05-29 NOTE — Discharge Summary (Signed)
SPORTS MEDICINE & JOINT REPLACEMENT   Georgena SpurlingStephen Lucey, MD   Laurier Nancyolby Worth Kober, PA-C [address], [place], [place]  [address]                             [phone]  PATIENT ID: Savannah Reese Sorce        MRN:  [id number]          DOB/AGE: 62/11/1955 / 60 y.o.    DISCHARGE SUMMARY  ADMISSION DATE:    05/28/2016 DISCHARGE DATE:   05/29/2016   ADMISSION DIAGNOSIS: primary osteoarthritis left knee    DISCHARGE DIAGNOSIS:  primary osteoarthritis left knee    ADDITIONAL DIAGNOSIS: Active Problems:   S/P total knee replacement  Past Medical History:  Diagnosis Date   Arthritis    Hypertension     PROCEDURE: Procedure(s): TOTAL KNEE ARTHROPLASTY on 05/28/2016  CONSULTS:    HISTORY:  See H&P in chart  HOSPITAL COURSE:  Savannah Reese Babilonia is a 60 y.o. admitted on 05/28/2016 and found to have a diagnosis of primary osteoarthritis left knee.  After appropriate laboratory studies were obtained  they were taken to the operating room on 05/28/2016 and underwent Procedure(s): TOTAL KNEE ARTHROPLASTY.   They were given perioperative antibiotics:  Anti-infectives    Start     Dose/Rate Route Frequency Ordered Stop   05/28/16 1400  ceFAZolin (ANCEF) IVPB 1 g/50 mL premix     1 g 100 mL/hr over 30 Minutes Intravenous Every 6 hours 05/28/16 1252 05/28/16 2031   05/28/16 0555  ceFAZolin (ANCEF) 2-4 GM/100ML-% IVPB    Comments:  Forte, Lindsi   : cabinet override      05/28/16 0555 05/28/16 0740   05/28/16 0547  ceFAZolin (ANCEF) IVPB 2g/100 mL premix     2 g 200 mL/hr over 30 Minutes Intravenous On call to O.R. 05/28/16 [id number] 05/28/16 0740    .  Patient given tranexamic acid IV or topical and exparel intra-operatively.  Tolerated the procedure well.    POD# 1: Vital signs were stable.  Patient denied Chest pain, shortness of breath, or calf pain.  Patient was started on Lovenox 30 mg subcutaneously twice daily at 8am.  Consults to PT, OT, and care management were made.  The patient was  weight bearing as tolerated.  CPM was placed on the operative leg 0-90 degrees for 6-8 hours a day. When out of the CPM, patient was placed in the foam block to achieve full extension. Incentive spirometry was taught.  Dressing was changed.       POD #2, Continued  PT for ambulation and exercise program.  IV saline locked.  O2 discontinued.    The remainder of the hospital course was dedicated to ambulation and strengthening.   The patient was discharged on 1 Day Post-Op in  Good condition.  Blood products given:none  DIAGNOSTIC STUDIES: Recent vital signs: Patient Vitals for the past 24 hrs:  BP Temp Temp src Pulse Resp SpO2  05/29/16 0619 137/75 97.6 F (36.4 C) Oral 93 16 99 %  05/28/16 2046 129/82 97.6 F (36.4 C) Oral 82 16 99 %  05/28/16 1829 (!) 151/79 98 F (36.7 C) - 71 16 100 %       Recent laboratory studies:  Recent Labs  05/29/16 0329  WBC 10.9*  HGB 11.4*  HCT 35.7*  PLT 203    Recent Labs  05/29/16 0329  NA 138  K 4.1  CL 107  CO2 22  BUN 22*  CREATININE 0.87  GLUCOSE 123*  CALCIUM 8.6*   Lab Results  Component Value Date   INR 1.03 05/18/2016   INR 0.93 03/25/2014     Recent Radiographic Studies :  Dg Chest 2 View  Result Date: 05/18/2016 CLINICAL DATA:  Preoperative evaluation for a LEFT total knee replacement, history hypertension EXAM: CHEST  2 VIEW COMPARISON:  11/29/2015 FINDINGS: Upper normal heart size. Mediastinal contours and pulmonary vascularity normal. Lungs clear. No pleural effusion or pneumothorax. Thoracolumbar scoliosis. IMPRESSION: No acute abnormalities. Electronically Signed   By: Ulyses Southward M.D.   On: 05/18/2016 14:59    DISCHARGE INSTRUCTIONS: Discharge Instructions    CPM    Complete by:  As directed    Continuous passive motion machine (CPM):      Use the CPM from 0 to 90 for 4-6 hours per day.      You may increase by 10 per day.  You may break it up into 2 or 3 sessions per day.      Use CPM for 2 weeks or until  you are told to stop.   Call MD / Call 911    Complete by:  As directed    If you experience chest pain or shortness of breath, CALL 911 and be transported to the hospital emergency room.  If you develope a fever above 101 F, pus (white drainage) or increased drainage or redness at the wound, or calf pain, call your surgeon's office.   Constipation Prevention    Complete by:  As directed    Drink plenty of fluids.  Prune juice may be helpful.  You may use a stool softener, such as Colace (over the counter) 100 mg twice a day.  Use MiraLax (over the counter) for constipation as needed.   Diet - low sodium heart healthy    Complete by:  As directed    Discharge instructions    Complete by:  As directed    INSTRUCTIONS AFTER JOINT REPLACEMENT   Remove items at home which could result in a fall. This includes throw rugs or furniture in walking pathways ICE to the affected joint every three hours while awake for 30 minutes at a time, for at least the first 3-5 days, and then as needed for pain and swelling.  Continue to use ice for pain and swelling. You may notice swelling that will progress down to the foot and ankle.  This is normal after surgery.  Elevate your leg when you are not up walking on it.   Continue to use the breathing machine you got in the hospital (incentive spirometer) which will help keep your temperature down.  It is common for your temperature to cycle up and down following surgery, especially at night when you are not up moving around and exerting yourself.  The breathing machine keeps your lungs expanded and your temperature down.   DIET:  As you were doing prior to hospitalization, we recommend a well-balanced diet.  DRESSING / WOUND CARE / SHOWERING  Keep the surgical dressing until follow up.  The dressing is water proof, so you can shower without any extra covering.  IF THE DRESSING FALLS OFF or the wound gets wet inside, change the dressing with sterile gauze.  Please  use good hand washing techniques before changing the dressing.  Do not use any lotions or creams on the incision until instructed by your surgeon.    ACTIVITY  Increase activity slowly  as tolerated, but follow the weight bearing instructions below.   No driving for 6 weeks or until further direction given by your physician.  You cannot drive while taking narcotics.  No lifting or carrying greater than 10 lbs. until further directed by your surgeon. Avoid periods of inactivity such as sitting longer than an hour when not asleep. This helps prevent blood clots.  You may return to work once you are authorized by your doctor.     WEIGHT BEARING   Weight bearing as tolerated with assist device (walker, cane, etc) as directed, use it as long as suggested by your surgeon or therapist, typically at least 4-6 weeks.   EXERCISES  Results after joint replacement surgery are often greatly improved when you follow the exercise, range of motion and muscle strengthening exercises prescribed by your doctor. Safety measures are also important to protect the joint from further injury. Any time any of these exercises cause you to have increased pain or swelling, decrease what you are doing until you are comfortable again and then slowly increase them. If you have problems or questions, call your caregiver or physical therapist for advice.   Rehabilitation is important following a joint replacement. After just a few days of immobilization, the muscles of the leg can become weakened and shrink (atrophy).  These exercises are designed to build up the tone and strength of the thigh and leg muscles and to improve motion. Often times heat used for twenty to thirty minutes before working out will loosen up your tissues and help with improving the range of motion but do not use heat for the first two weeks following surgery (sometimes heat can increase post-operative swelling).   These exercises can be done on a  training (exercise) mat, on the floor, on a table or on a bed. Use whatever works the best and is most comfortable for you.    Use music or television while you are exercising so that the exercises are a pleasant break in your day. This will make your life better with the exercises acting as a break in your routine that you can look forward to.   Perform all exercises about fifteen times, three times per day or as directed.  You should exercise both the operative leg and the other leg as well.   Exercises include:   Quad Sets - Tighten up the muscle on the front of the thigh (Quad) and hold for 5-10 seconds.   Straight Leg Raises - With your knee straight (if you were given a brace, keep it on), lift the leg to 60 degrees, hold for 3 seconds, and slowly lower the leg.  Perform this exercise against resistance later as your leg gets stronger.  Leg Slides: Lying on your back, slowly slide your foot toward your buttocks, bending your knee up off the floor (only go as far as is comfortable). Then slowly slide your foot back down until your leg is flat on the floor again.  Angel Wings: Lying on your back spread your legs to the side as far apart as you can without causing discomfort.  Hamstring Strength:  Lying on your back, push your heel against the floor with your leg straight by tightening up the muscles of your buttocks.  Repeat, but this time bend your knee to a comfortable angle, and push your heel against the floor.  You may put a pillow under the heel to make it more comfortable if necessary.   A rehabilitation program  following joint replacement surgery can speed recovery and prevent re-injury in the future due to weakened muscles. Contact your doctor or a physical therapist for more information on knee rehabilitation.    CONSTIPATION  Constipation is defined medically as fewer than three stools per week and severe constipation as less than one stool per week.  Even if you have a regular bowel  pattern at home, your normal regimen is likely to be disrupted due to multiple reasons following surgery.  Combination of anesthesia, postoperative narcotics, change in appetite and fluid intake all can affect your bowels.   YOU MUST use at least one of the following options; they are listed in order of increasing strength to get the job done.  They are all available over the counter, and you may need to use some, POSSIBLY even all of these options:    Drink plenty of fluids (prune juice may be helpful) and high fiber foods Colace 100 mg by mouth twice a day  Senokot for constipation as directed and as needed Dulcolax (bisacodyl), take with full glass of water  Miralax (polyethylene glycol) once or twice a day as needed.  If you have tried all these things and are unable to have a bowel movement in the first 3-4 days after surgery call either your surgeon or your primary doctor.    If you experience loose stools or diarrhea, hold the medications until you stool forms back up.  If your symptoms do not get better within 1 week or if they get worse, check with your doctor.  If you experience "the worst abdominal pain ever" or develop nausea or vomiting, please contact the office immediately for further recommendations for treatment.   ITCHING:  If you experience itching with your medications, try taking only a single pain pill, or even half a pain pill at a time.  You can also use Benadryl over the counter for itching or also to help with sleep.   TED HOSE STOCKINGS:  Use stockings on both legs until for at least 2 weeks or as directed by physician office. They may be removed at night for sleeping.  MEDICATIONS:  See your medication summary on the "After Visit Summary" that nursing will review with you.  You may have some home medications which will be placed on hold until you complete the course of blood thinner medication.  It is important for you to complete the blood thinner medication as  prescribed.  PRECAUTIONS:  If you experience chest pain or shortness of breath - call 911 immediately for transfer to the hospital emergency department.   If you develop a fever greater that 101 F, purulent drainage from wound, increased redness or drainage from wound, foul odor from the wound/dressing, or calf pain - CONTACT YOUR SURGEON.                                                   FOLLOW-UP APPOINTMENTS:  If you do not already have a post-op appointment, please call the office for an appointment to be seen by your surgeon.  Guidelines for how soon to be seen are listed in your "After Visit Summary", but are typically between 1-4 weeks after surgery.  OTHER INSTRUCTIONS:   Knee Replacement:  Do not place pillow under knee, focus on keeping the knee straight while resting. CPM instructions: 0-90  degrees, 2 hours in the morning, 2 hours in the afternoon, and 2 hours in the evening. Place foam block, curve side up under heel at all times except when in CPM or when walking.  DO NOT modify, tear, cut, or change the foam block in any way.  MAKE SURE YOU:  Understand these instructions.  Get help right away if you are not doing well or get worse.    Thank you for letting us be a part of your medical care team.  It is a privilege we respect greatly.  We hope these instructions will help you stay on track for a fast and full recovery!   Increase activity slowly as tolerated    Complete by:  As directed       DISCHARGE MEDICATIONS:     Medication List    TAKE these medications   aspirin 325 MG EC tablet Take 1 tablet (325 mg total) by mouth 2 (two) times daily. What changed:  when to take this   atenolol 100 MG tablet Commonly known as:  TENORMIN Take 100 mg by mouth daily.   lisinopril 20 MG tablet Commonly known as:  PRINIVIL,ZESTRIL Take 20 mg by mouth daily.   methocarbamol 500 MG tablet Commonly known as:  ROBAXIN Take 1-2 tablets (500-1,000 mg total) by mouth every 6  (six) hours as needed for muscle spasms.   oxyCODONE 5 MG immediate release tablet Commonly known as:  Oxy IR/ROXICODONE Take 1-2 tablets (5-10 mg total) by mouth every 3 (three) hours as needed for breakthrough pain. What changed:  when to take this  Another medication with the same name was removed. Continue taking this medication, and follow the directions you see here.       FOLLOW UP VISIT:   Follow-up Information    [facility] .   Why:  Someone from [facility]) will contact you to arrange start date and time for therapy. Contact information: [address] [place] [address] [phone]           DISPOSITION: HOME VS. SNF  CONDITION:  Good   Guy Sandifer 05/29/2016, 3:35 PM

## 2016-05-29 NOTE — Care Management Note (Signed)
Case Management Note  Patient Details  Name: Iona CoachMaimouna Perona MRN: [id number] Date of Birth: 02/02/1956  Subjective/Objective:     60 yr old female s/p left total knee arthroplasty.               Action/Plan: patient was preoperatively setup with [facility], no changes. Has rolling walker and 3in1, CPM to be delivered to her home. Patient will have family support at discharge.    Expected Discharge Date:    05/29/16              Expected Discharge Plan:  Home w Home Health Services  In-House Referral:     Discharge planning Services  CM Consult  Post Acute Care Choice:  Home Health Choice offered to:  Adult Children  DME Arranged:  CPM DME Agency:     HH Arranged:  PT [facility] Agency[facility]  Status of Service:  Completed, signed off  If discussed at Long Length of Stay Meetings, dates discussed:    Additional Comments:  Durenda GuthrieBrady, Margerite Impastato Naomi, RN 05/29/2016, 4:05 PM

## 2016-05-29 NOTE — Progress Notes (Signed)
Physical Therapy Note  Pt demonstrates improved mobility this pm and decreased need for cues for technique or safety.  Son not present to interpret this session despite establishing a timeframe for pm session with him this morning.  At this time feel pt is moving well enough to return to home with family support.  Discussed this with RN who will f/u with family when they return.  Will continue to follow if remains on acute.      05/29/16 1457  PT Visit Information  Last PT Received On 05/29/16  Assistance Needed +1  History of Present Illness pt presents with L TKR.  pt with hx of HTN and R TKR.    Subjective Data  Subjective Son not present to interpret this session.  Patient Stated Goal Return to home  Precautions  Precautions Fall  Restrictions  Weight Bearing Restrictions Yes  LLE Weight Bearing WBAT  Pain Assessment  Pain Assessment 0-10  Pain Score 5  Pain Location pt again states "medium" when asked about pain.  Pain Descriptors / Indicators Grimacing;Guarding  Pain Intervention(s) Monitored during session;Premedicated before session;Repositioned  Cognition  Arousal/Alertness Awake/alert  Behavior During Therapy WFL for tasks assessed/performed  Overall Cognitive Status Within Functional Limits for tasks assessed  Bed Mobility  Overal bed mobility Needs Assistance  Bed Mobility Supine to Sit;Sit to Supine  Supine to sit Supervision  Sit to supine Min assist  General bed mobility comments A with returning L LE to bed.    Transfers  Overall transfer level Needs assistance  Equipment used Rolling walker (2 wheeled)  Transfers Sit to/from Stand  Sit to Stand Supervision  General transfer comment pt demonstrates good return on education from this am.    Ambulation/Gait  Ambulation/Gait assistance Min guard  Ambulation Distance (Feet) 250 Feet  Assistive device Rolling walker (2 wheeled)  Gait Pattern/deviations Step-through pattern;Decreased dorsiflexion - right   General Gait Details pt able to increase ambulation distance and denied need to sit during ambulation this pm.    Balance  Overall balance assessment Needs assistance  Sitting-balance support No upper extremity supported;Feet supported  Sitting balance-Leahy Scale Good  Standing balance support Single extremity supported;Bilateral upper extremity supported;During functional activity  Standing balance-Leahy Scale Poor  Exercises  Exercises Total Joint  Total Joint Exercises  Long Arc Quad AROM;Left;10 reps  Knee Flexion AROM;Left;10 reps  PT - End of Session  Equipment Utilized During Treatment Gait belt  Activity Tolerance Patient tolerated treatment well  Patient left in bed;with call bell/phone within reach;in CPM  Nurse Communication Mobility status  PT - Assessment/Plan  PT Plan Current plan remains appropriate  PT Frequency (ACUTE ONLY) 7X/week  Follow Up Recommendations Home health PT;Supervision/Assistance - 24 hour  PT equipment 3in1 (PT)  PT Goal Progression  Progress towards PT goals Progressing toward goals  Acute Rehab PT Goals  PT Goal Formulation With patient  Time For Goal Achievement 06/05/16  Potential to Achieve Goals Good  PT Time Calculation  PT Start Time (ACUTE ONLY) 1337  PT Stop Time (ACUTE ONLY) 1403  PT Time Calculation (min) (ACUTE ONLY) 26 min  PT General Charges  $$ ACUTE PT VISIT 1 Procedure  PT Treatments  $Gait Training 8-22 mins  $Therapeutic Exercise 8-22 mins   Sherina Stammer, PT [phone]

## 2016-05-29 NOTE — Progress Notes (Signed)
SPORTS MEDICINE AND JOINT REPLACEMENT  Savannah SpurlingStephen Lucey, MD    Laurier Nancyolby Darrell Hauk, PA-C [address], [place], [place]  [address]                             [phone]   PROGRESS NOTE  Subjective:  negative for Chest Pain  negative for Shortness of Breath  negative for Nausea/Vomiting   negative for Calf Pain  negative for Bowel Movement   Tolerating Diet: yes         Patient reports pain as 5 on 0-10 scale.    Objective: Vital signs in last 24 hours:   Patient Vitals for the past 24 hrs:  BP Temp Temp src Pulse Resp SpO2  05/28/16 2046 129/82 97.6 F (36.4 C) Oral 82 16 99 %  05/28/16 1829 (!) 151/79 98 F (36.7 C) - 71 16 100 %  05/28/16 1259 - 97.6 F (36.4 C) Oral - - -  05/28/16 1248 130/85 - - 72 - 100 %  05/28/16 1220 (!) 138/97 - - (!) 58 (!) 9 98 %  05/28/16 1205 140/88 - - 61 10 98 %  05/28/16 1150 137/90 - - 78 14 100 %  05/28/16 1135 (!) 134/107 - - (!) 58 (!) 9 98 %  05/28/16 1120 (!) 138/98 - - 70 (!) 9 98 %  05/28/16 1105 (!) 140/93 - - 61 (!) 8 98 %  05/28/16 1050 - - - 69 10 97 %  05/28/16 1045 135/86 - - 71 11 97 %  05/28/16 1030 140/82 - - 82 12 98 %  05/28/16 1015 - - - 75 (!) 9 96 %  05/28/16 1000 (!) 142/84 - - 82 14 98 %  05/28/16 0945 137/82 - - 89 13 100 %  05/28/16 0930 130/79 - - 84 12 100 %  05/28/16 0917 127/83 (!) 96.6 F (35.9 C) - 90 13 97 %    @flow {1959:LAST@   Intake/Output from previous day:   09/25 0701 - 09/26 0700 In: 2143.8 [P.O.:240; I.V.:1743.8] Out: 550 [Urine:400]   Intake/Output this shift:   No intake/output data recorded.   Intake/Output      09/25 0701 - 09/26 0700 09/26 0701 - 09/27 0700   P.O. 240    I.V. (mL/kg) 1743.8 (18.6)    IV Piggyback 160    Total Intake(mL/kg) 2143.8 (22.8)    Urine (mL/kg/hr) 400 (0.2)    Blood 150 (0.1)    Total Output 550     Net +1593.8          Urine Occurrence 303 x       LABORATORY DATA:  Recent Labs  05/29/16 0329  WBC 10.9*  HGB 11.4*  HCT 35.7*  PLT 203     Recent Labs  05/29/16 0329  NA 138  K 4.1  CL 107  CO2 22  BUN 22*  CREATININE 0.87  GLUCOSE 123*  CALCIUM 8.6*   Lab Results  Component Value Date   INR 1.03 05/18/2016   INR 0.93 03/25/2014    Examination:  General appearance: alert, cooperative and no distress Extremities: extremities normal, atraumatic, no cyanosis or edema  Wound Exam: clean, dry, intact   Drainage:  None: wound tissue dry  Motor Exam: Quadriceps and Hamstrings Intact  Sensory Exam: Superficial Peroneal, Deep Peroneal and Tibial normal   Assessment:    1 Day Post-Op  Procedure(s) (LRB): TOTAL KNEE ARTHROPLASTY (Left)  ADDITIONAL DIAGNOSIS:  Active Problems:   S/P total knee replacement  Acute Blood Loss Anemia   Plan: Physical Therapy as ordered Weight Bearing as Tolerated (WBAT)  DVT Prophylaxis:  Aspirin  DISCHARGE PLAN: Home  DISCHARGE NEEDS: HHPT     Patient is doing great. Anticipate D/C after her PT sessions today    Guy Sandifer 05/29/2016, 7:06 AM

## 2016-10-31 DIAGNOSIS — H539 Unspecified visual disturbance: Secondary | ICD-10-CM | POA: Insufficient documentation

## 2017-09-25 DIAGNOSIS — K219 Gastro-esophageal reflux disease without esophagitis: Secondary | ICD-10-CM | POA: Insufficient documentation

## 2017-09-25 DIAGNOSIS — E782 Mixed hyperlipidemia: Secondary | ICD-10-CM | POA: Insufficient documentation

## 2017-11-12 IMAGING — CR DG CHEST 2V
2 series · 2 of 2 positions shown · non-contrast
Comparison: 11/29/2015

CLINICAL DATA: Preoperative evaluation for a LEFT total knee
replacement, history hypertension

EXAM:
CHEST  2 VIEW

[w chest pa]
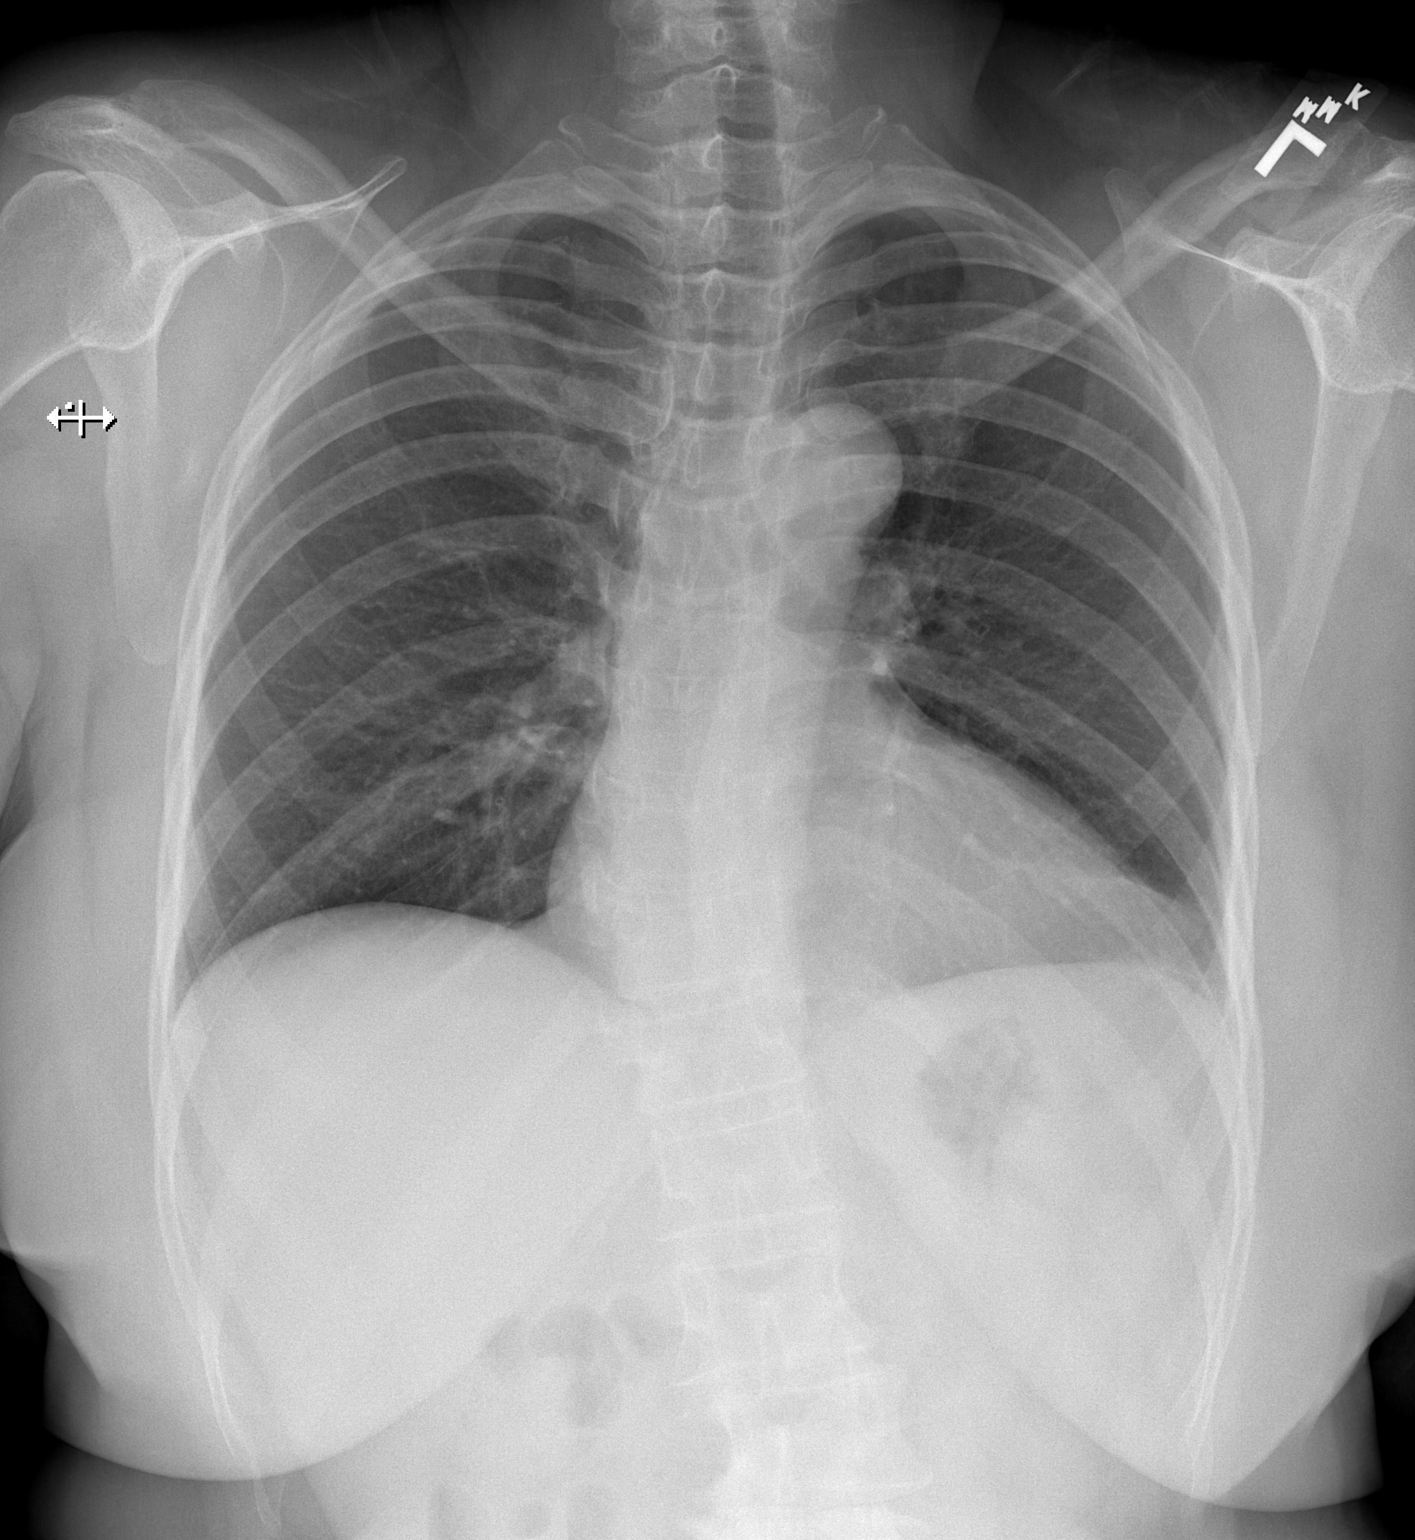

[w chest lat]
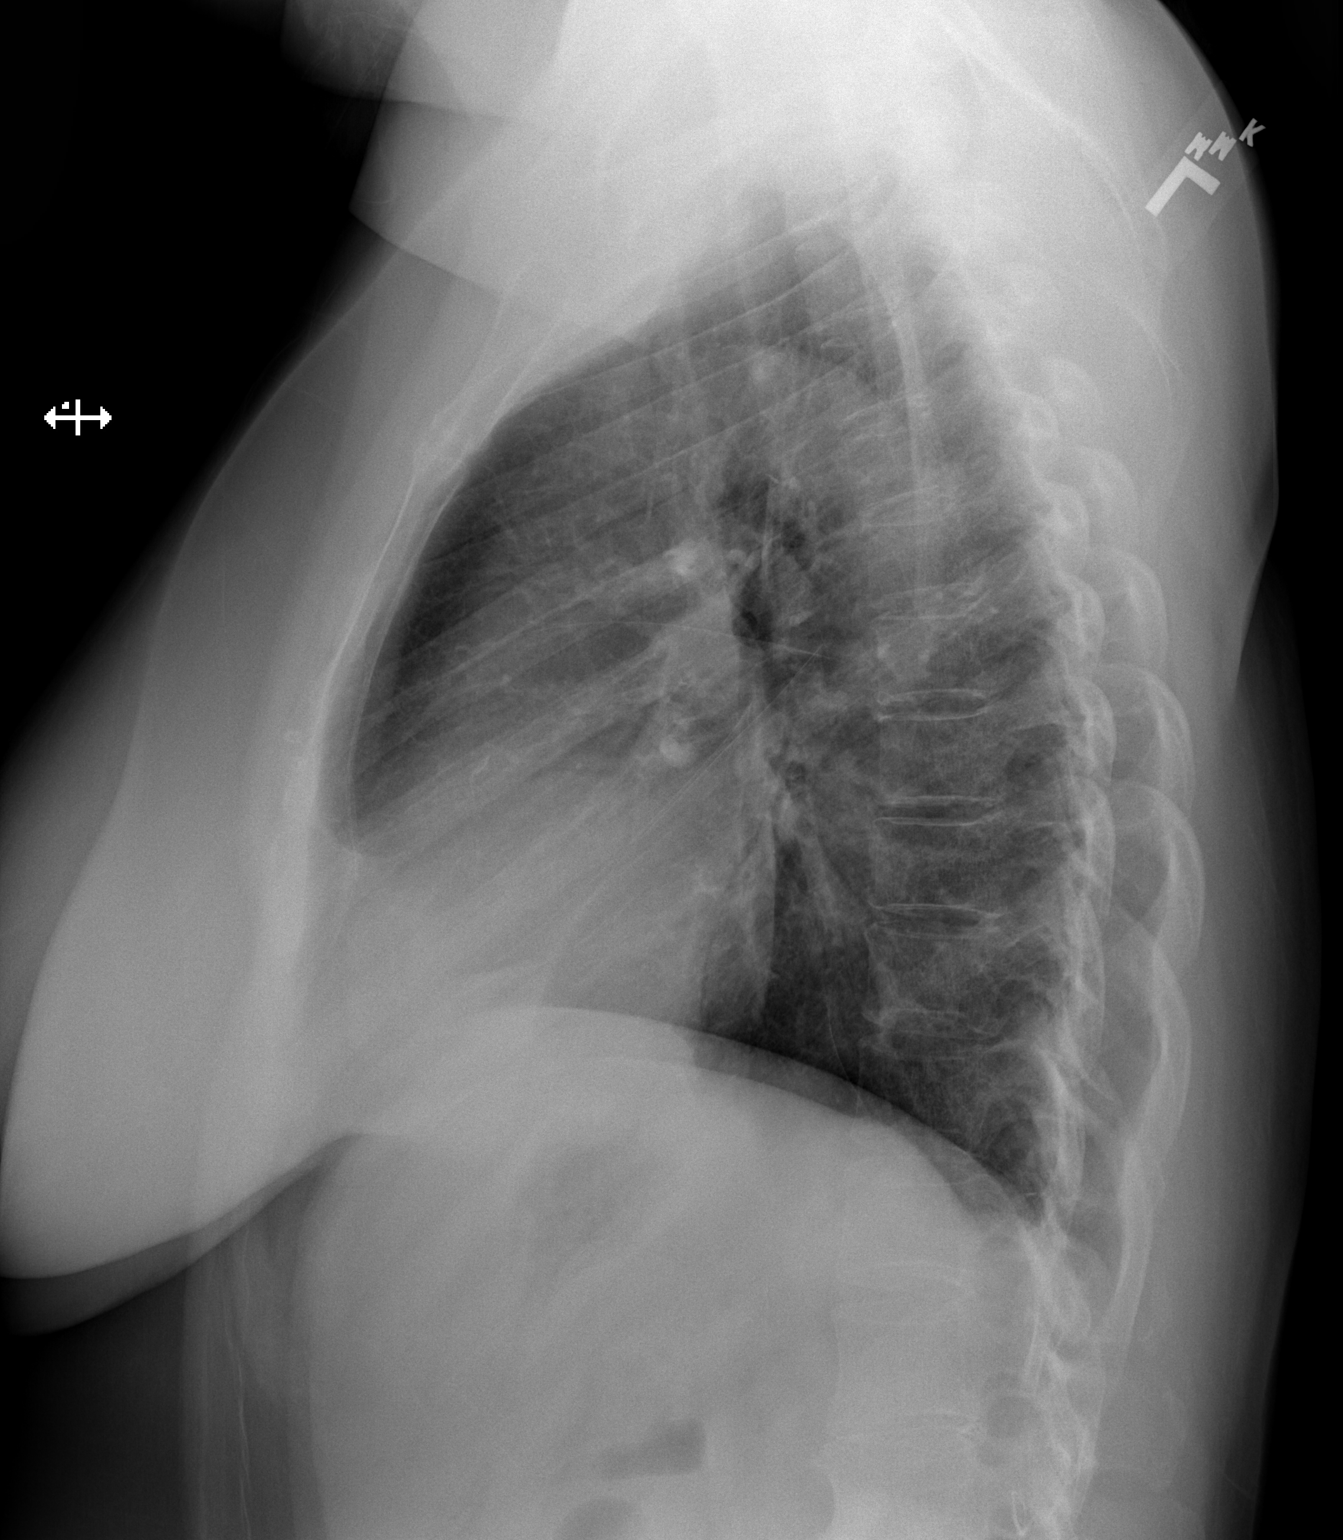

[2 of 2 positions shown; findings below may reference images not displayed]

FINDINGS: Upper normal heart size.

Mediastinal contours and pulmonary vascularity normal.

Lungs clear.

No pleural effusion or pneumothorax.

Thoracolumbar scoliosis.
IMPRESSION: No acute abnormalities.

## 2018-09-06 ENCOUNTER — Encounter (HOSPITAL_COMMUNITY): Payer: Self-pay

## 2018-09-06 ENCOUNTER — Emergency Department (HOSPITAL_COMMUNITY)
Admission: EM | Admit: 2018-09-06 | Discharge: 2018-09-07 | Disposition: A | Discharge status: Home / Self Care | Payer: Self-pay | Attending: Emergency Medicine | Admitting: Emergency Medicine

## 2018-09-06 DIAGNOSIS — R062 Wheezing: Secondary | ICD-10-CM

## 2018-09-06 DIAGNOSIS — J40 Bronchitis, not specified as acute or chronic: Secondary | ICD-10-CM | POA: Insufficient documentation

## 2018-09-06 DIAGNOSIS — I10 Essential (primary) hypertension: Secondary | ICD-10-CM | POA: Insufficient documentation

## 2018-09-06 DIAGNOSIS — Z79899 Other long term (current) drug therapy: Secondary | ICD-10-CM | POA: Insufficient documentation

## 2018-09-06 DIAGNOSIS — R0602 Shortness of breath: Secondary | ICD-10-CM

## 2018-09-06 NOTE — ED Triage Notes (Signed)
Onset 4 months cough.  Onset few days shortness of breath.  NAD at triage, talking to family without difficulties.

## 2018-09-07 ENCOUNTER — Emergency Department (HOSPITAL_COMMUNITY): Payer: Self-pay

## 2018-09-07 LAB — BASIC METABOLIC PANEL
Anion gap: 9 (ref 5–15)
BUN: 20 mg/dL (ref 8–23)
CO2: 24 mmol/L (ref 22–32)
Calcium: 9.2 mg/dL (ref 8.9–10.3)
Chloride: 105 mmol/L (ref 98–111)
Creatinine, Ser: 1.08 mg/dL — ABNORMAL HIGH (ref 0.44–1.00)
GFR calc Af Amer: >60 mL/min (ref >60)
GFR calc non Af Amer: 54 mL/min — ABNORMAL LOW (ref >60)
Glucose, Bld: 108 mg/dL — ABNORMAL HIGH (ref 70–99)
Potassium: 3.9 mmol/L (ref 3.5–5.1)
Sodium: 138 mmol/L (ref 135–145)

## 2018-09-07 LAB — CBC WITH DIFFERENTIAL/PLATELET
Abs Immature Granulocytes: 0.01 10*3/uL (ref 0.00–0.07)
Basophils Absolute: 0 10*3/uL (ref 0.0–0.1)
Basophils Relative: 1 %
EOS PCT: 3 %
Eosinophils Absolute: 0.2 10*3/uL (ref 0.0–0.5)
HCT: 40.6 % (ref 36.0–46.0)
Hemoglobin: 12.6 g/dL (ref 12.0–15.0)
Immature Granulocytes: 0 %
Lymphocytes Relative: 38 %
Lymphs Abs: 2.6 10*3/uL (ref 0.7–4.0)
MCH: 28.3 pg (ref 26.0–34.0)
MCHC: 31 g/dL (ref 30.0–36.0)
MCV: 91.2 fL (ref 80.0–100.0)
MONO ABS: 0.6 10*3/uL (ref 0.1–1.0)
Monocytes Relative: 9 %
Neutro Abs: 3.4 10*3/uL (ref 1.7–7.7)
Neutrophils Relative %: 49 %
Platelets: 252 10*3/uL (ref 150–400)
RBC: 4.45 MIL/uL (ref 3.87–5.11)
RDW: 13.3 % (ref 11.5–15.5)
WBC: 6.9 10*3/uL (ref 4.0–10.5)
nRBC: 0 % (ref 0.0–0.2)

## 2018-09-07 LAB — BRAIN NATRIURETIC PEPTIDE: B Natriuretic Peptide: 16.7 pg/mL (ref 0.0–100.0)

## 2018-09-07 LAB — I-STAT TROPONIN, ED: Troponin i, poc: 0 ng/mL (ref 0.00–0.08)

## 2018-09-07 MED ORDER — IPRATROPIUM-ALBUTEROL 0.5-2.5 (3) MG/3ML IN SOLN
RESPIRATORY_TRACT | Status: AC
  Filled 2018-09-07: qty 3

## 2018-09-07 MED ORDER — ALBUTEROL SULFATE HFA 108 (90 BASE) MCG/ACT IN AERS
2.0000 | INHALATION_SPRAY | Freq: Once | RESPIRATORY_TRACT | Status: AC
  Administered 2018-09-07: 2 via RESPIRATORY_TRACT
  Filled 2018-09-07: qty 6.7

## 2018-09-07 MED ORDER — METHYLPREDNISOLONE 4 MG PO TBPK: ORAL_TABLET | ORAL | 0 refills | Status: DC

## 2018-09-07 MED ORDER — IPRATROPIUM-ALBUTEROL 0.5-2.5 (3) MG/3ML IN SOLN
3.0000 mL | Freq: Once | RESPIRATORY_TRACT | Status: AC
  Administered 2018-09-07: 3 mL via RESPIRATORY_TRACT
  Filled 2018-09-07: qty 3

## 2018-09-07 NOTE — ED Provider Notes (Signed)
[facility]  CSN: [id number] Arrival date & time: 09/06/18 1930  Chief Complaint(s) Cough; Shortness of Breath; and Headache  HPI Savannah Reese is a 63 y.o. female    Cough  This is a recurrent problem. Episode onset: 4 months. The problem occurs every few hours. The problem has not changed since onset.The cough is non-productive. There has been no fever. Associated symptoms include chest pain (constant sternal pain; worse with cough. sharp), ear pain (bilateral), headaches, rhinorrhea and shortness of breath. She is not a smoker. Her past medical history does not include bronchitis, pneumonia, bronchiectasis, COPD or asthma.  Shortness of Breath  This is a new problem. The average episode lasts 1 week. The problem occurs continuously.The problem has not changed since onset.Associated symptoms include headaches, rhinorrhea, ear pain (bilateral), cough, sputum production and chest pain (constant sternal pain; worse with cough. sharp). Associated medical issues do not include asthma, COPD or pneumonia.  Headache   Associated symptoms include shortness of breath.    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 Authorizing Provider  aspirin EC 325 MG EC tablet Take 1 tablet (325 mg total) by mouth 2 (two) times daily. 05/29/16   Guy Sandiferobbins, Colby Alan, PA  atenolol (TENORMIN) 100 MG tablet Take 100 mg by mouth daily.    [provider]  lisinopril (PRINIVIL,ZESTRIL) 20 MG tablet Take 20 mg by mouth daily.    [provider]  methocarbamol (ROBAXIN) 500 MG tablet Take 1-2 tablets (500-1,000 mg total) by mouth every 6 (six) hours as  needed for muscle spasms. 05/29/16   Guy Sandiferobbins, Colby Alan, PA  methylPREDNISolone (MEDROL DOSEPAK) 4 MG TBPK tablet Use as directed on the package 09/07/18   Berklie Dethlefs, Amadeo GarnetPedro Eduardo, MD  oxyCODONE (OXY IR/ROXICODONE) 5 MG immediate release tablet Take 1-2 tablets (5-10 mg total) by mouth every 3 (three) hours as needed for breakthrough pain. 05/29/16   Guy Sandiferobbins, Colby Alan, PA                                                                                                                                    Past Surgical History Past Surgical History:  Procedure Laterality Date   TOTAL KNEE ARTHROPLASTY Right 04/05/2014   Procedure: TOTAL KNEE ARTHROPLASTY;  Surgeon: Dannielle HuhSteve Lucey, MD;  Location: [facility] OR;  Service: Orthopedics;  Laterality: Right;   TOTAL KNEE ARTHROPLASTY Left 05/28/2016   Procedure: TOTAL KNEE ARTHROPLASTY;  Surgeon: Dannielle HuhSteve Lucey, MD;  Location: [facility] OR;  Service: Orthopedics;  Laterality: Left;   Family History History reviewed. No pertinent family history.  Social History Social History  Tobacco Use   Smoking status: Never Smoker   Smokeless tobacco: Never Used  Substance Use Topics   Alcohol use: No   Drug use: No   Allergies Patient has no known allergies.  Review of Systems Review of Systems  HENT: Positive for ear pain (bilateral) and rhinorrhea.   Respiratory: Positive for cough, sputum production and shortness of breath.   Cardiovascular: Positive for chest pain (constant sternal pain; worse with cough. sharp).  Neurological: Positive for headaches.   All other systems are reviewed and are negative for acute change except as noted in the HPI  Physical Exam Vital Signs  I have reviewed the triage vital signs BP 129/86 (BP Location: Right Arm)    Pulse 70    Temp 98.9 F (37.2 C) (Oral)    Resp 18    SpO2 99%   Physical Exam Vitals signs reviewed.  Constitutional:      General: She is not in acute distress.    Appearance: She is well-developed. She is not  diaphoretic.  HENT:     Head: Normocephalic and atraumatic.     Nose: Nose normal.  Eyes:     General: No scleral icterus.       Right eye: No discharge.        Left eye: No discharge.     Conjunctiva/sclera: Conjunctivae normal.     Pupils: Pupils are equal, round, and reactive to light.  Neck:     Musculoskeletal: Normal range of motion and neck supple.  Cardiovascular:     Rate and Rhythm: Normal rate and regular rhythm.     Heart sounds: No murmur. No friction rub. No gallop.   Pulmonary:     Effort: Pulmonary effort is normal. No respiratory distress.     Breath sounds: No stridor. Wheezing (diffuse exp wheezing) present. No rales.  Abdominal:     General: There is no distension.     Palpations: Abdomen is soft.     Tenderness: There is no abdominal tenderness.  Musculoskeletal:        General: No tenderness.  Skin:    General: Skin is warm and dry.     Findings: No erythema or rash.  Neurological:     Mental Status: She is alert and oriented to person, place, and time.     ED Results and Treatments Labs (all labs ordered are listed, but only abnormal results are displayed) Labs Reviewed  BASIC METABOLIC PANEL - Abnormal; Notable for the following components:      Result Value   Glucose, Bld 108 (*)    Creatinine, Ser 1.08 (*)    GFR calc non Af Amer 54 (*)    All other components within normal limits  CBC WITH DIFFERENTIAL/PLATELET  BRAIN NATRIURETIC PEPTIDE  I-STAT TROPONIN, ED  EKG  EKG Interpretation  Date/Time:  "Sunday September 07 2018 03:05:37 EST Ventricular Rate:  71 PR Interval:    QRS Duration: 91 QT Interval:  415 QTC Calculation: 451 R Axis:   59 Text Interpretation:  Sinus rhythm Ventricular premature complex Baseline wander in lead(s) V6 No significant change since last tracing Confirmed by Zowie Lundahl ([id number]) on 09/07/2018  3:31:37 AM      Radiology Dg Chest 2 View  Result Date: 09/07/2018 CLINICAL DATA:  Chronic cough. Acute onset of shortness of breath. EXAM: CHEST - 2 VIEW COMPARISON:  Chest radiograph performed 10/28/2017 FINDINGS: The lungs are well-aerated and clear. There is no evidence of focal opacification, pleural effusion or pneumothorax. The heart is borderline normal in size. No acute osseous abnormalities are seen. IMPRESSION: No acute cardiopulmonary process seen. Electronically Signed   By: Jeffery  Chang M.D.   On: 09/07/2018 03:30   Pertinent labs & imaging results that were available during my care of the patient were reviewed by me and considered in my medical decision making (see chart for details).  Medications Ordered in ED Medications  ipratropium-albuterol (DUONEB) 0.5-2.5 (3) MG/3ML nebulizer solution 3 mL (has no administration in time range)  albuterol (PROVENTIL HFA;VENTOLIN HFA) 108 (90 Base) MCG/ACT inhaler 2 puff (has no administration in time range)                                                                                                                                    Procedures Procedures  (including critical care time)  Medical Decision Making / ED Course I have reviewed the nursing notes for this encounter and the patient's prior records (if available in EHR or on provided paperwork).    Work-up negative for heart failure, pneumonia.  Doubt pulmonary embolism.  Most consistent with bronchitis/reactive airway disease.  Patient given breathing treatment improving her wheezing and shortness of breath.  Will DC with albuterol inhaler and Medrol Dosepak.  The patient appears reasonably screened and/or stabilized for discharge and I doubt any other medical condition or other EMC requiring further screening, evaluation, or treatment in the ED at this time prior to discharge.  The patient is safe for discharge with strict return precautions.   Final Clinical  Impression(s) / ED Diagnoses Final diagnoses:  Wheezing  SOB (shortness of breath)  Bronchitis   Disposition: Discharge  Condition: Good  I have discussed the results, Dx and Tx plan with the patient who expressed understanding and agree(s) with the plan. Discharge instructions discussed at great length. The patient was given strict return precautions who verbalized understanding of the instructions. No further questions at time of discharge.    ED Discharge Orders         Ordered    methylPREDNISolone (MEDROL DOSEPAK) 4 MG TBPK tablet     01" /05/20 0446           Follow Up: Primary care provider  Schedule  an appointment as soon as possible for a visit  in 1-2 weeks, If symptoms do not improve or  worsen      This chart was dictated using voice recognition software.  Despite best efforts to proofread,  errors can occur which can change the documentation meaning.   Nira Conn, MD 09/07/18 [phone]

## 2018-09-07 NOTE — Discharge Instructions (Signed)
Bronchite aigu, adulte La bronchite aigu se produit lorsque les tubes  air (bronches) des poumons gonflent soudainement. Cette condition peut rendre la respiration difficile. Il peut galement provoquer ces symptmes: Une toux. Cracher du mucus clair, jaune ou vert. Respiration sifflante. Congestion de la poitrine. Essoufflement. Une fivre. Courbatures. Frissons. Un mal de gorge. Suivez ces instructions  la maison:  Mdicaments Prenez des [employer] en vente libre et sur ordonnance uniquement selon les instructions de votre mdecin. Si un antibiotique vous a t prescrit, prenez-le comme indiqu par votre mdecin. N'arrtez pas de prendre l'antibiotique mme si vous commencez  vous sentir mieux. Instructions gnrales Du repos. Buvez suffisamment de liquides pour garder [employer] (urine) jaune ple. vitez de fumer et de Astronomer. Si vous fumez et que vous avez besoin d'aide pour arrter, [employer]. Arrter de fumer aidera vos poumons  gurir plus rapidement. Utilisez un inhalateur, un vaporisateur  brume frache ou un humidificateur comme indiqu par [employer]. Gardez toutes les visites de suivi comme indiqu par [employer]. C'est important. Comment cela est-il vit? Pour rduire Holiday representative de Scientist, research (medical)  nouveau cette condition: Lavez-vous souvent les mains  l'eau et au savon. Si vous ne pouvez pas utiliser de savon et d'eau, [place] un dsinfectant [employer]. vitez tout contact [employer] qui prsentent des symptmes de rhume. Essayez de ne pas toucher vos mains  votre bouche, votre nez ou vos yeux. Assurez-vous de Veterinary surgeon contre la grippe chaque anne. Contactez un mdecin si: Vos symptmes ne s'amliorent pas en 2 semaines. [place] de l'aide immdiatement si: Vous Actor sang. Vous avez des [employer]. Vous avez un trs mauvais essoufflement. Vous devenez dshydrat. Vous vous vanouissez  (vanouissez) ou continuez  vous sentir comme si vous vous vanouissiez. Vous continuez  vomir (vomissements). Vous avez un trs mauvais mal de tte. Votre fivre ou vos frissons s'aggravent. Ces informations ne sont pas destines  remplacer les conseils donns par votre fournisseur de soins de sant. Assurez-vous de Scientific laboratory technician de vos questions avec votre professionnel de la sant. Document publi: 02/06/2008 Document rvis: 04/03/2017 Document rvis: 02/08/2016 ducation interactive des patients Elsevier  2019 [employer].

## 2018-09-07 NOTE — ED Notes (Signed)
Patient verbalizes understanding of discharge instructions. Opportunity for questioning and answers were provided. Armband removed by staff, pt discharged from ED by wheelchair

## 2018-09-12 ENCOUNTER — Ambulatory Visit: Payer: Self-pay | Attending: Family Medicine | Admitting: Family Medicine

## 2018-09-12 ENCOUNTER — Encounter: Payer: Self-pay | Admitting: Family Medicine

## 2018-09-12 VITALS — BP 139/85 | HR 97 | Temp 98.7°F | Resp 18 | Ht 65.5 in | Wt 214.0 lb

## 2018-09-12 DIAGNOSIS — J40 Bronchitis, not specified as acute or chronic: Secondary | ICD-10-CM | POA: Insufficient documentation

## 2018-09-12 DIAGNOSIS — I10 Essential (primary) hypertension: Secondary | ICD-10-CM | POA: Insufficient documentation

## 2018-09-12 DIAGNOSIS — R7303 Prediabetes: Secondary | ICD-10-CM

## 2018-09-12 DIAGNOSIS — Z79899 Other long term (current) drug therapy: Secondary | ICD-10-CM | POA: Insufficient documentation

## 2018-09-12 DIAGNOSIS — R0982 Postnasal drip: Secondary | ICD-10-CM | POA: Insufficient documentation

## 2018-09-12 DIAGNOSIS — R0981 Nasal congestion: Secondary | ICD-10-CM

## 2018-09-12 DIAGNOSIS — K219 Gastro-esophageal reflux disease without esophagitis: Secondary | ICD-10-CM | POA: Insufficient documentation

## 2018-09-12 DIAGNOSIS — Z96653 Presence of artificial knee joint, bilateral: Secondary | ICD-10-CM | POA: Insufficient documentation

## 2018-09-12 DIAGNOSIS — R1013 Epigastric pain: Secondary | ICD-10-CM

## 2018-09-12 LAB — POCT GLYCOSYLATED HEMOGLOBIN (HGB A1C): Hemoglobin A1C: 5.9 % — AB (ref 4.0–5.6)

## 2018-09-12 LAB — GLUCOSE, POCT (MANUAL RESULT ENTRY): POC Glucose: 168 mg/dL — AB (ref 70–99)

## 2018-09-12 MED ORDER — LISINOPRIL 20 MG PO TABS: 20.0000 mg | ORAL_TABLET | Freq: Every day | ORAL | 5 refills | Status: DC

## 2018-09-12 MED ORDER — OMEPRAZOLE 40 MG PO CPDR: 40.0000 mg | DELAYED_RELEASE_CAPSULE | Freq: Every day | ORAL | 3 refills | Status: DC

## 2018-09-12 MED ORDER — ATENOLOL 100 MG PO TABS: 100.0000 mg | ORAL_TABLET | Freq: Every day | ORAL | 5 refills | Status: DC

## 2018-09-12 MED ORDER — CETIRIZINE HCL 10 MG PO TABS: 10.0000 mg | ORAL_TABLET | Freq: Every day | ORAL | 11 refills | Status: DC

## 2018-09-12 NOTE — Progress Notes (Signed)
Subjective:    Patient ID: Savannah Reese, female    DOB: 1956-06-02, 63 y.o.   MRN: [id number]   Patient is accompanied by a live interpreter at today's visit  HPI       63 yo female new to the practice. Patient with a history of prediabetes, HTN and is s/p bilateral knee replacements. Patient was recently seen in the ED of 09/06/2018 and diagnosed with bronchitis which was treated with a prednisone dose pack.  Patient is still taking the prednisone and states that her breathing/shortness of breath is better.  Patient still with complaint of bilateral ear pain/pressure, frontal headache and nasal congestion.      Patient reports medical history significant for prediabetes which was diagnosed in 2017, and hypertension.  She denies increased thirst, urinary frequency, blurred vision or increased appetite related to prediabetes.  Patient reports no headaches or dizziness related to her blood pressure except for recent frontal headaches associated with her nasal congestion and cough.  Patient reports that she has been on the same blood pressure medication since about 2015 and she wonders if she needs stronger medication.  Patient also reports that she sometimes gets a burning sensation in her stomach after she eats spicy or oily foods and after eating rice.  Patient sometimes has burping/belching as well as backwash of bad tasting liquid into her mouth/throat after certain foods and sometimes at night after lying down.  Patient denies any other abdominal pain.  Patient denies any nausea or vomiting, no constipation or diarrhea, no blood in the stools and no dark stools.  Past Medical History:  Diagnosis Date  . Arthritis   . Hypertension    Past Surgical History:  Procedure Laterality Date  . 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 TOTAL KNEE ARTHROPLASTY Left 05/28/2016    Procedure: TOTAL KNEE ARTHROPLASTY;  Surgeon: Dannielle Huh, MD;  Location: [facility] OR;  Service: Orthopedics;  Laterality: Left;   Social History   Tobacco Use  . Smoking status: Never Smoker  . Smokeless tobacco: Never Used  Substance Use Topics  . Alcohol use: No  . Drug use: No  No Known Allergies    Review of Systems  Constitutional: Positive for fatigue. Negative for chills and fever.  HENT: Positive for congestion, ear pain, postnasal drip and rhinorrhea. Negative for ear discharge, sore throat and trouble swallowing.   Respiratory: Positive for cough (improved). Negative for shortness of breath.   Cardiovascular: Negative for chest pain and palpitations.  Gastrointestinal: Negative for abdominal pain, constipation, diarrhea and nausea.  Endocrine: Negative for polydipsia, polyphagia and polyuria.  Genitourinary: Negative for dysuria and flank pain.  Musculoskeletal: Negative for arthralgias and back pain.  Neurological: Positive for headaches. Negative for dizziness.  Hematological: Negative for adenopathy. Does not bruise/bleed easily.       Objective:   Physical Exam BP 139/85 (BP Location: Right Arm, Patient Position: Sitting, Cuff Size: Large)   Pulse 97   Temp 98.7 F (37.1 C) (Oral)   Resp 18   Ht 5' 5.5" (1.664 m)   Wt 214 lb (97.1 kg)   SpO2 95%   BMI 35.07 kg/m Nurse's notes and vital signs reviewed  General-well-nourished, well-developed overweight/obese female in no acute distress.  Patient is accompanied by an interpreter at today's visit ENT-TMs dull bilaterally, nares with moderate edema of the nasal turbinates with clear and  slightly off-white mucoid discharge.  Patient with posterior pharynx/tonsillar arch edema/erythema with cobblestoning Neck-supple, no lymphadenopathy Lungs- clear to auscultation bilaterally, no increased work of breathing, no wheezing Cardiovascular-regular rate and rhythm Abdomen-mild truncal obesity, soft, mild abdominal distention.   Patient with mild epigastric tenderness to palpation, no rebound or guarding Back-no CVA tenderness Extremities-no edema       Assessment & Plan:  1. Prediabetes  Hemoglobin A1c done at today's visit in follow-up of prediabetes.  Patient's A1c was 5.9.  Continuation of a low carbohydrate diet as well as regular exercise with goal of weight loss was encouraged.  Patient also had elevated glucose at 168 but patient has recently been on prednisone for her bronchitis and this is likely the cause of her elevated blood sugar at today's visit.  Patient should remain well-hydrated and return to clinic if she feels that she is having issues with increased thirst, increased hunger, blurred vision or urinary frequency and will have recheck of her glucose.  Patient will also have BMP at today's visit. - HgB A1c - Glucose (CBG) - Basic Metabolic Panel  2. Essential hypertension Patient's blood pressure was controlled at today's visit at 139/85.  Patient will continue her lisinopril and atenolol.  Low-sodium diet and regular exercise encouraged.  Patient will have BMP in follow-up of use of blood pressure medication - Basic Metabolic Panel - lisinopril (PRINIVIL,ZESTRIL) 20 MG tablet; Take 1 tablet (20 mg total) by mouth daily. To lower blood pressure  Dispense: 30 tablet; Refill: 5 - atenolol (TENORMIN) 100 MG tablet; Take 1 tablet (100 mg total) by mouth daily. To lower blood pressure  Dispense: 30 tablet; Refill: 5  3. Epigastric discomfort Patient with complaint of epigastric discomfort and has known history of acid reflux.  Patient provided with new prescription for omeprazole 40 mg.  Patient was asked to try and avoid foods that she knows she does not tolerate well as well as avoidance of fried or greasy foods.  Patient should also avoid eating within 2 hours of bedtime.  Patient should return or seek other medical follow-up if if she has any issues with continued epigastric pain, worsening of  epigastric pain, blood in the stools or dark/black stools.  Patient is to return in approximately 6 weeks for further follow-up. - Basic Metabolic Panel  4. Gastroesophageal reflux disease, esophagitis presence not specified Patient will be placed on omeprazole 40 mg and is to avoid late night eating as well as avoidance of spicy/greasy foods and patient should return if her symptoms have not improved in the next 4 to 6 weeks. - omeprazole (PRILOSEC) 40 MG capsule; Take 1 capsule (40 mg total) by mouth daily. To reduce stomach acid  Dispense: 30 capsule; Refill: 3  5. Nasal congestion Patient with continued nasal congestion and ear pressure status post emergency department visit for bronchitis which has improved after the use of a prednisone taper.  Prescription provided for Zyrtec 10 mg for patient to take at bedtime to help with nasal congestion and postnasal drainage.  Patient should call or return if her symptoms have not improved over the next 1 to 2 weeks. - cetirizine (ZYRTEC) 10 MG tablet; Take 1 tablet (10 mg total) by mouth at bedtime. As needed for nasal congestion  Dispense: 30 tablet; Refill: 11   An After Visit Summary was printed and given to the patient.  Return in about 6 weeks (around 10/24/2018).

## 2018-09-13 LAB — BASIC METABOLIC PANEL WITH GFR
BUN/Creatinine Ratio: 21 (ref 12–28)
BUN: 21 mg/dL (ref 8–27)
CO2: 25 mmol/L (ref 20–29)
Calcium: 9.2 mg/dL (ref 8.7–10.3)
Chloride: 99 mmol/L (ref 96–106)
Creatinine, Ser: 1 mg/dL (ref 0.57–1.00)
GFR calc Af Amer: 69 mL/min/1.73
GFR calc non Af Amer: 60 mL/min/1.73
Glucose: 95 mg/dL (ref 65–99)
Potassium: 3.6 mmol/L (ref 3.5–5.2)
Sodium: 141 mmol/L (ref 134–144)

## 2018-10-03 ENCOUNTER — Telehealth: Payer: Self-pay | Admitting: *Deleted

## 2018-10-03 NOTE — Telephone Encounter (Signed)
-----   Message from Cain Saupe, MD sent at 09/13/2018  7:17 PM EST ----- Please notify patient that her BMP was normal

## 2018-10-03 NOTE — Telephone Encounter (Signed)
Medical Assistant used [employer] to contact patient.  Interpreter Name: Elana Alm #: [id number] Patient is aware of labs being normal Voicemail states to give a call back to [place] with [facility] at [phone].

## 2018-10-27 ENCOUNTER — Ambulatory Visit: Payer: Self-pay | Admitting: Family Medicine

## 2018-10-29 ENCOUNTER — Encounter: Payer: Self-pay | Admitting: Family Medicine

## 2018-10-29 ENCOUNTER — Other Ambulatory Visit: Payer: Self-pay

## 2018-10-29 ENCOUNTER — Ambulatory Visit: Payer: Self-pay | Attending: Family Medicine | Admitting: Family Medicine

## 2018-10-29 VITALS — BP 144/84 | HR 67 | Temp 97.2°F | Resp 18 | Wt 220.6 lb

## 2018-10-29 DIAGNOSIS — E785 Hyperlipidemia, unspecified: Secondary | ICD-10-CM

## 2018-10-29 DIAGNOSIS — I10 Essential (primary) hypertension: Secondary | ICD-10-CM

## 2018-10-29 DIAGNOSIS — Z79899 Other long term (current) drug therapy: Secondary | ICD-10-CM

## 2018-10-29 DIAGNOSIS — K219 Gastro-esophageal reflux disease without esophagitis: Secondary | ICD-10-CM

## 2018-10-29 DIAGNOSIS — R1013 Epigastric pain: Secondary | ICD-10-CM

## 2018-10-29 MED ORDER — OMEPRAZOLE 40 MG PO CPDR: 40.0000 mg | DELAYED_RELEASE_CAPSULE | Freq: Every day | ORAL | 0 refills | Status: DC

## 2018-10-29 MED ORDER — ATENOLOL 100 MG PO TABS: 100.0000 mg | ORAL_TABLET | Freq: Every day | ORAL | 0 refills | Status: DC

## 2018-10-29 MED ORDER — LISINOPRIL 20 MG PO TABS: 20.0000 mg | ORAL_TABLET | Freq: Every day | ORAL | 0 refills | Status: DC

## 2018-10-29 MED FILL — ATENOLOL 100 MG TABLET: 100 | 180 days supply | Qty: 180 | Fill #0

## 2018-10-29 MED FILL — OMEPRAZOLE DR 40 MG CAPSULE: 40 | 180 days supply | Qty: 180 | Fill #0

## 2018-10-29 MED FILL — LISINOPRIL 20 MG TAB: 20 | 180 days supply | Qty: 180 | Fill #0

## 2018-10-29 NOTE — Progress Notes (Signed)
Patient  arrived ambulatory, alert and ambulatory with a need for a 6 week follow up.  Patient was seen previously for bronchitis.  Patient not presenting with any symptoms at this time

## 2018-10-29 NOTE — Progress Notes (Signed)
Subjective:    Patient ID: Savannah Reese, female    DOB: Sep 12, 1955, 62 y.o.   MRN: [id number]   Due to a language barrier, [employer] video interpretation system was offered to the patient/son but son wanted to act as the interpreter  HPI       63 yo female seen in follow-up of hypertension and at her last office visit patient also had complaint of acid reflux symptoms with epigastric burning sensation for which she was placed on omeprazole.  Patient still with some epigastric discomfort but improved.  Patient also with complaint of generalized "muscle pain" in her abdomen.  Patient and son seem to have great difficulty clarifying the pain and how long it had been occurring but patient finally reported that she believes the pain has only been occurring for about a week.  Patient was unable to describe with the pain feels like but states that it is only on the surface of her abdomen and not deep inside.  Patient denies nausea/vomiting/diarrhea or constipation.  No blood in the stool.  Patient has had no loss of appetite.  Patient was recently seen by sports medicine about her chronic issues with low back pain with radiation and patient has some improvement with prednisone taper however per note, family medicine wishes for patient to be seen by [facility].      Patient is taking her blood pressure medication daily.  Patient denies any headaches or dizziness related to her blood pressure.  Patient is curious as to whether or not she needs to be on more medication or higher dose to further lower her blood pressure.  Patient however will be leaving in a few days to return to her home country of [place].  Son states that patient will be gone for at least 6 months and her second was today if she can get a 55-month supply of her medications.  Patient reports no other medical concerns.  Patient denies any headaches or dizziness, no chest pain or palpitations, no shortness of breath or cough and no  peripheral edema.  Patient has had no urinary frequency, urgency or dysuria.  Past Medical History:  Diagnosis Date  . Arthritis   . Hypertension    Past Surgical History:  Procedure Laterality Date  . 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 TOTAL KNEE ARTHROPLASTY Left 05/28/2016   Procedure: TOTAL KNEE ARTHROPLASTY;  Surgeon: Dannielle Huh, MD;  Location: [facility] OR;  Service: Orthopedics;  Laterality: Left;   Family History  Problem Relation Age of Onset  . Hypertension Mother   . Hypertension Father    Social History   Tobacco Use  . Smoking status: Never Smoker  . Smokeless tobacco: Never Used  Substance Use Topics  . Alcohol use: No  . Drug use: No  No Known Allergies    Review of Systems  Constitutional: Negative for chills, fatigue and fever.  HENT: Negative for congestion, sore throat and trouble swallowing.   Respiratory: Negative for cough and shortness of breath.   Cardiovascular: Negative for chest pain, palpitations and leg swelling.  Gastrointestinal: Negative for abdominal pain and blood in stool.  Endocrine: Negative for cold intolerance, heat intolerance, polydipsia, polyphagia and polyuria.  Genitourinary: Negative for dysuria and frequency.  Musculoskeletal: Positive for back pain. Negative for gait problem.  Neurological: Negative for dizziness and headaches.  Hematological: Negative  for adenopathy. Does not bruise/bleed easily.       Objective:   Physical Exam BP (!) 144/84 (BP Location: Left Arm, Patient Position: Sitting, Cuff Size: Large)   Pulse 67   Temp (!) 97.2 F (36.2 C)   Resp 18   Wt 220 lb 9.6 oz (100.1 kg)   SpO2 98%   BMI 36.15 kg/m Nurse's notes and vital signs reviewed General-well-nourished, well-developed overweight older female in no acute distress.  Patient is accompanied by her son and granddaughter at  today's visit  TMs gray, nares with mild edema, normal oropharynx Neck-supple, no lymphadenopathy Cardiovascular-regular rate and rhythm Abdomen-mild truncal obesity, no reproducible epigastric tenderness on exam, no rebound or guarding Back- no CVA tenderness, patient with mild lumbosacral and left SI joint discomfort to palpation and patient with bilateral lumbosacral paraspinous spasm Extremities-patient with mild, nonpitting distal lower extremity edema bilaterally       Assessment & Plan:  1. Essential hypertension Patient's blood pressure is near goal of 130-140/80 or less.  Patient is encouraged to continue low-sodium diet, continue regular exercise such as walking and patient will remain on the same medication at this time as patient will be leaving later this week to return to her home country for up to 6 months.  If patient is having any headaches or dizziness or feels that her blood pressure remains elevated, she should seek medical attention willfully.  Prescription sent to patient's pharmacy for 6 months supplies of her medications and son will go by pharmacy today to see if 6 months supplies will be honored. - lisinopril (PRINIVIL,ZESTRIL) 20 MG tablet; Take 1 tablet (20 mg total) by mouth daily. To lower blood pressure  Dispense: 180 tablet; Refill: 0 - atenolol (TENORMIN) 100 MG tablet; Take 1 tablet (100 mg total) by mouth daily. To lower blood pressure  Dispense: 180 tablet; Refill: 0 - Lipid panel  2. Gastroesophageal reflux disease, esophagitis presence not specified Patient reports that her abdominal discomfort from her last visit has decreased with the use of Prilosec which patient will continue.  Patient is also encouraged to continue to avoid known trigger foods as well as avoidance of late night eating - omeprazole (PRILOSEC) 40 MG capsule; Take 1 capsule (40 mg total) by mouth daily. To reduce stomach acid  Dispense: 180 capsule; Refill: 0  3. Epigastric pain Patient  still reports some occasional burning sensation in the epigastric area despite omeprazole use.  Will check lipase as well as CMP to make sure that there are no other issues contributing to her abdominal pain.  Patient made aware that she should seek medical attention her home country if she has continued issues with abdominal pain - Lipase - Comprehensive metabolic panel  4. Hyperlipidemia, unspecified hyperlipidemia type Patient has had prior hyperlipidemia and will have lipid panel at today's visit.  She will be notified regarding possible need for cholesterol medication based on labs.  Patient will have lipid panel and CMP at today's visit - Lipid panel - Comprehensive metabolic panel  5. Encounter for long-term current use of medication Patient will have CMP done at today's visit in follow-up of her abdominal pain as well as long-term use of medications for treatment of hypertension  An After Visit Summary was printed and given to the patient.  Return in about 6 months (around 04/29/2019) for chronic issues.

## 2018-10-30 LAB — COMPREHENSIVE METABOLIC PANEL WITH GFR
ALT: 11 IU/L (ref 0–32)
AST: 15 IU/L (ref 0–40)
Albumin/Globulin Ratio: 1.3 (ref 1.2–2.2)
Albumin: 4.2 g/dL (ref 3.8–4.8)
Alkaline Phosphatase: 56 IU/L (ref 39–117)
BUN/Creatinine Ratio: 16 (ref 12–28)
BUN: 16 mg/dL (ref 8–27)
Bilirubin Total: 0.3 mg/dL (ref 0.0–1.2)
CO2: 23 mmol/L (ref 20–29)
Calcium: 9.7 mg/dL (ref 8.7–10.3)
Chloride: 102 mmol/L (ref 96–106)
Creatinine, Ser: 1.02 mg/dL — ABNORMAL HIGH (ref 0.57–1.00)
GFR calc Af Amer: 67 mL/min/1.73
GFR calc non Af Amer: 58 mL/min/1.73 — ABNORMAL LOW
Globulin, Total: 3.3 g/dL (ref 1.5–4.5)
Glucose: 92 mg/dL (ref 65–99)
Potassium: 4.3 mmol/L (ref 3.5–5.2)
Sodium: 143 mmol/L (ref 134–144)
Total Protein: 7.5 g/dL (ref 6.0–8.5)

## 2018-10-30 LAB — LIPID PANEL
Chol/HDL Ratio: 4.5 ratio — ABNORMAL HIGH (ref 0.0–4.4)
Cholesterol, Total: 237 mg/dL — ABNORMAL HIGH (ref 100–199)
HDL: 53 mg/dL
LDL Calculated: 163 mg/dL — ABNORMAL HIGH (ref 0–99)
Triglycerides: 105 mg/dL (ref 0–149)
VLDL Cholesterol Cal: 21 mg/dL (ref 5–40)

## 2018-10-30 LAB — LIPASE: Lipase: 23 U/L (ref 14–72)

## 2018-10-31 ENCOUNTER — Telehealth: Payer: Self-pay | Admitting: Emergency Medicine

## 2018-10-31 ENCOUNTER — Other Ambulatory Visit: Payer: Self-pay | Admitting: Family Medicine

## 2018-10-31 DIAGNOSIS — E785 Hyperlipidemia, unspecified: Secondary | ICD-10-CM

## 2018-10-31 MED ORDER — ATORVASTATIN CALCIUM 20 MG PO TABS: 20.0000 mg | ORAL_TABLET | Freq: Every day | ORAL | 0 refills | Status: DC

## 2018-10-31 MED FILL — ATORVASTATIN 20 MG TABLET: 20 | 180 days supply | Qty: 180 | Fill #0

## 2018-10-31 NOTE — Progress Notes (Signed)
Patient ID: Savannah Reese, female   DOB: 05-Jan-1956, 63 y.o.   MRN: [id number]   Patient with LDL of 163 on recent lipid panel.  Prescription sent to patient's pharmacy for Lipitor 20 mg once daily.  Patient will be leaving the country for 6 months and will be notified to follow-up with a physician in her home country in 4 to 6 weeks after starting the cholesterol medication to have recheck of her liver enzymes

## 2018-10-31 NOTE — Telephone Encounter (Signed)
Patient son called and was asking for his mothers lab results.  Patient has a language barrier.  Patients son stated that he would get his mother on the phone through a translator app due to the fact that the patients son was not on the list of people to give out results.  Even though Nursing told the son this he could be heard in the back ground.  Patient was told that her Cholesterol was elevated and that her creatine was slightly elevated.  Son wanted a prescription for the cholesterol.  Patients son stated that mother didn't drink water at all.  Nursing advised that she needed  To drink water.  Patient son stated that patient was going to [place] in two weeks and wanted an appointment before she went.  Nursing stated that concerns would be forward to the Doctor and if they needed anything it would be communicated to them.

## 2018-10-31 NOTE — Telephone Encounter (Signed)
Please notify patient's son that his mother's creatinine was 1.02 with normal of 1.00 so this does not immediate attention. Also, I will send in a cholesterol medication but his mother needs to follow-up with a physician in her home country in 4-6 weeks after starting the cholesterol medication to have her liver enzymes re-checked. RX for atorvastatin 20 mg sent to this pharmacy

## 2018-11-03 NOTE — Telephone Encounter (Signed)
Patient's son called and informed that Dr. Jillyn Hidden had .  Patient's son was told that she had prescription sent to patient's pharmacy for Lipitor 20 mg once daily. Patient's son told to follow-up with a physician in her home country in 4 to 6 weeks after starting the cholesterol medication to have recheck of her liver enzymes.  Patient's son also told that Dr, Jillyn Hidden had seen the patient creatine level and felt that it didn't need addressing at this time.

## 2018-11-10 ENCOUNTER — Telehealth: Payer: Self-pay | Admitting: *Deleted

## 2018-11-10 NOTE — Telephone Encounter (Signed)
-----   Message from Cain Saupe, MD sent at 11/04/2018  5:29 PM EST ----- Please notify patient that her lipase level was normal at 23. Her bad cholesterol is elevated at 163.  Please follow a low-fat diet and continue to take atorvastatin 20 mg daily

## 2018-11-10 NOTE — Telephone Encounter (Signed)
Medical Assistant used [employer] to contact patient.  Interpreter Name: Gae Dry Interpreter #: [phone] Patient is aware of pancreas level being normal and needing to take atorvastatin and eat a low fat diet to address elevated cholesterol.

## 2019-04-06 ENCOUNTER — Telehealth: Payer: Self-pay | Admitting: Family Medicine

## 2019-04-08 ENCOUNTER — Encounter: Payer: Self-pay | Admitting: Family Medicine

## 2019-04-08 ENCOUNTER — Other Ambulatory Visit: Payer: Self-pay

## 2019-04-08 ENCOUNTER — Ambulatory Visit: Payer: Self-pay | Attending: Family Medicine | Admitting: Family Medicine

## 2019-04-08 DIAGNOSIS — J40 Bronchitis, not specified as acute or chronic: Secondary | ICD-10-CM

## 2019-04-08 DIAGNOSIS — R0989 Other specified symptoms and signs involving the circulatory and respiratory systems: Secondary | ICD-10-CM

## 2019-04-08 DIAGNOSIS — J069 Acute upper respiratory infection, unspecified: Secondary | ICD-10-CM

## 2019-04-08 MED ORDER — DOXYCYCLINE HYCLATE 100 MG PO TABS: 100.0000 mg | ORAL_TABLET | Freq: Two times a day (BID) | ORAL | 0 refills | Status: DC

## 2019-04-08 MED ORDER — PREDNISONE 20 MG PO TABS: 20.0000 mg | ORAL_TABLET | Freq: Every day | ORAL | 0 refills | Status: DC

## 2019-04-08 NOTE — Progress Notes (Signed)
Cough, dry cough that causing pain in chest. For about 3 wks.

## 2019-04-08 NOTE — Progress Notes (Signed)
Virtual Visit via Telephone Note  I connected with Savannah Reese on 04/08/19 at 8:55 am EST by telephone and verified that I am speaking with the correct person using two identifiers.   I discussed the limitations, risks, security and privacy concerns of performing an evaluation and management service by telephone and the availability of in person appointments. I also discussed with the patient that there may be a patient responsible charge related to this service. The patient expressed understanding and agreed to proceed.  Patient Location: Home Provider Location: Office at [facility] Others participating in call: call initiated by Guillermina Cityctavia Richard, RMA and then transferred to me; Patient's son, Arbutus PedMohamed, wished to act as interpreter   History of Present Illness:      63 yo female last seen in the office on 10/29/2018 who has complaint of onset of a cough for the past 3 weeks.  She denies any known exposure to anyone with COVID-19.  For the most part, cough has been nonproductive but patient per son feels that there is sputum in her chest that she cannot cough up.  Patient also with nasal congestion.  She denies sore throat other than throat irritation with coughing and from postnasal drainage from the nasal congestion.  Patient has been taking cetirizine for several months but this has not helped with the nasal congestion.  Patient feels like she did when she had bronchitis in December/January and was seen in the emergency department for bronchitis.  Per chart, patient was seen 09/06/2018 and chest x-ray negative.  Patient has some occasional mid chest discomfort with coughing.  Upon questioning regarding wheezing or any abnormal noise in the lungs, patient replies that she feels as if she has wheezing in her nose at nighttime.  She has had no fever or chills.  No headaches.  No body aches.  No loss of sense of taste or smell.  No left-sided chest pain.  No palpitations.  She has had some mild fatigue.  No  GI symptoms such as nausea/vomiting or diarrhea.  Near the end of the visit, son reports that patient is currently staying with [place]   Past Medical History:  Diagnosis Date  . Arthritis   . Hypertension     Past Surgical History:  Procedure Laterality Date  . 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 TOTAL KNEE ARTHROPLASTY Left 05/28/2016   Procedure: TOTAL KNEE ARTHROPLASTY;  Surgeon: Dannielle HuhSteve Lucey, MD;  Location: [facility] OR;  Service: Orthopedics;  Laterality: Left;    Family History  Problem Relation Age of Onset  . Hypertension Mother   . Hypertension Father     Social History   Tobacco Use  . Smoking status: Never Smoker  . Smokeless tobacco: Never Used  Substance Use Topics  . Alcohol use: No  . Drug use: No     No Known Allergies     Observations/Objective: No vital signs or physical exam conducted as visit was done via telephone  Assessment and Plan: 1. Chest congestion;2. URI with cough and congestion; 3.  Bronchitis Discussed possibility of chest x-ray however [place].  Prescription provided for doxycycline and prednisone taper for chest congestion possibly secondary to bronchitis or URI with cough and congestion.  Patient may also take over-the-counter Robitussin-DM to help with cough.  Urgent care if  any acute worsening of cough or onset of fever.  ED if increased shortness of breath, chest pain or difficulty breathing.  Prednisone given as 5-day course of same dose as patient son seem to have difficulty remembering dosing instructions which he was told would be on the bottle and son repeatedly asked to have repeat of which medications, and how patient was to take the medications and whether or not she should eat before or after the medications. - doxycycline  (VIBRA-TABS) 100 MG tablet; Take 1 tablet (100 mg total) by mouth 2 (two) times daily.  Dispense: 20 tablet; Refill: 0 - predniSONE (DELTASONE) 20 MG tablet; Take 1 tablet (20 mg total) by mouth daily with breakfast. Take 2 pills by mouth once per day for 5 days; eat before taking the medicine  Dispense: 10 tablet; Refill: 0   Follow Up Instructions:    I discussed the assessment and treatment plan with the patient. The patient was provided an opportunity to ask questions and all were answered. The patient agreed with the plan and demonstrated an understanding of the instructions.   The patient was advised to call back or seek an in-person evaluation if the symptoms worsen or if the condition fails to improve as anticipated.  I provided 21 minutes of non-face-to-face time during this encounter.   Antony Blackbird, MD

## 2019-04-10 ENCOUNTER — Other Ambulatory Visit: Payer: Self-pay | Admitting: Family Medicine

## 2019-04-10 DIAGNOSIS — R05 Cough: Secondary | ICD-10-CM

## 2019-04-10 DIAGNOSIS — R0989 Other specified symptoms and signs involving the circulatory and respiratory systems: Secondary | ICD-10-CM

## 2019-04-10 DIAGNOSIS — R058 Other specified cough: Secondary | ICD-10-CM

## 2019-04-10 NOTE — Telephone Encounter (Signed)
Informed patient son with what provider stated and he verbalized understanding.

## 2019-04-10 NOTE — Progress Notes (Signed)
Patient ID: Savannah Reese, female   DOB: [birth year], 63 y.o.   MRN: [id number]   Call received that patient's son would know like to have CXR order placed.

## 2019-04-10 NOTE — Telephone Encounter (Signed)
[name] is requesting chest xray before appt 04/22/19 .

## 2019-04-10 NOTE — Telephone Encounter (Signed)
Spoke with patient son and he stated he would like for provider to please put in xray order because he have decided to take patient to get it done since this had been an issue for a while now. Per pt he's trying to make sure patient gets all of her test and results back before she leaves to go back home to her country.

## 2019-04-10 NOTE — Telephone Encounter (Signed)
Order placed for CXR at [facility]

## 2019-04-14 ENCOUNTER — Other Ambulatory Visit: Payer: Self-pay

## 2019-04-14 ENCOUNTER — Telehealth: Payer: Self-pay

## 2019-04-14 DIAGNOSIS — I10 Essential (primary) hypertension: Secondary | ICD-10-CM

## 2019-04-14 DIAGNOSIS — R0981 Nasal congestion: Secondary | ICD-10-CM

## 2019-04-14 DIAGNOSIS — K219 Gastro-esophageal reflux disease without esophagitis: Secondary | ICD-10-CM

## 2019-04-14 DIAGNOSIS — E785 Hyperlipidemia, unspecified: Secondary | ICD-10-CM

## 2019-04-14 NOTE — Telephone Encounter (Signed)
Son says cannot take pt to xray this week. Son will take pt on Monday 04/20/19 for the chest xray

## 2019-04-14 NOTE — Telephone Encounter (Signed)
Son states pt gets med refilled every six months. Son wants to go ahead and request refill so it is ready in time for pt when she goes out of the country the end of next week per son. All medications.   Send to [facility] on [address]  Reminded son of pt appt 04/22/19 with [facility].

## 2019-04-15 MED ORDER — LISINOPRIL 20 MG PO TABS: 20.0000 mg | ORAL_TABLET | Freq: Every day | ORAL | 0 refills | Status: DC

## 2019-04-15 MED ORDER — ATENOLOL 100 MG PO TABS: 100.0000 mg | ORAL_TABLET | Freq: Every day | ORAL | 0 refills | Status: DC

## 2019-04-15 MED ORDER — OMEPRAZOLE 40 MG PO CPDR: 40.0000 mg | DELAYED_RELEASE_CAPSULE | Freq: Every day | ORAL | 0 refills | Status: DC

## 2019-04-15 MED ORDER — ATORVASTATIN CALCIUM 20 MG PO TABS: 20.0000 mg | ORAL_TABLET | Freq: Every day | ORAL | 0 refills | Status: DC

## 2019-04-15 MED ORDER — CETIRIZINE HCL 10 MG PO TABS: 10.0000 mg | ORAL_TABLET | Freq: Every day | ORAL | 0 refills | Status: DC

## 2019-04-15 NOTE — Telephone Encounter (Signed)
noted °

## 2019-04-22 ENCOUNTER — Ambulatory Visit (HOSPITAL_COMMUNITY)
Admission: RE | Admit: 2019-04-22 | Discharge: 2019-04-22 | Disposition: A | Discharge status: Home / Self Care | Payer: Self-pay | Source: Ambulatory Visit | Attending: Family Medicine | Admitting: Family Medicine

## 2019-04-22 ENCOUNTER — Other Ambulatory Visit: Payer: Self-pay

## 2019-04-22 ENCOUNTER — Encounter: Payer: Self-pay | Admitting: Family Medicine

## 2019-04-22 ENCOUNTER — Ambulatory Visit: Payer: Self-pay | Attending: Family Medicine | Admitting: Family Medicine

## 2019-04-22 VITALS — BP 132/88 | HR 69 | Temp 98.3°F | Ht 65.5 in | Wt 224.8 lb

## 2019-04-22 DIAGNOSIS — K219 Gastro-esophageal reflux disease without esophagitis: Secondary | ICD-10-CM

## 2019-04-22 DIAGNOSIS — Z23 Encounter for immunization: Secondary | ICD-10-CM

## 2019-04-22 DIAGNOSIS — R0989 Other specified symptoms and signs involving the circulatory and respiratory systems: Secondary | ICD-10-CM | POA: Insufficient documentation

## 2019-04-22 DIAGNOSIS — R7303 Prediabetes: Secondary | ICD-10-CM

## 2019-04-22 DIAGNOSIS — I10 Essential (primary) hypertension: Secondary | ICD-10-CM

## 2019-04-22 DIAGNOSIS — R05 Cough: Secondary | ICD-10-CM | POA: Insufficient documentation

## 2019-04-22 DIAGNOSIS — E785 Hyperlipidemia, unspecified: Secondary | ICD-10-CM

## 2019-04-22 DIAGNOSIS — R058 Other specified cough: Secondary | ICD-10-CM

## 2019-04-22 LAB — POCT GLYCOSYLATED HEMOGLOBIN (HGB A1C): Hemoglobin A1C: 6 % — AB (ref 4.0–5.6)

## 2019-04-22 NOTE — Progress Notes (Signed)
Established Patient Office Visit  Subjective:  Patient ID: Savannah Reese, female    DOB: 03/31/1956  Age: 63 y.o. MRN: [id number]  CC: No chief complaint on file.   [facility] follow-up of chronic medical issues.  Patient is status post telemedicine visit on 04/08/2019 due to complaint of chest congestion and cough.  Patient and son state that most of her symptoms have resolved.  Chest x-ray was ordered but patient did not obtain the chest x-ray yet because she has been staying in [place] with her son.  Son intends to have patient have x-ray done after today's visit.  Patient had plan to return to her home country however this was delayed due to the COVID-19 pandemic but she will be leaving later this week.        She reports no issues with her blood pressure and has been taking her medications daily.  No headaches or dizziness related to her blood pressure.  She continues to take atorvastatin for her hyperlipidemia.  Patient would like to have blood work done at today's visit in follow-up of her chronic issues that she will be out of the country for approximately 6 months.  Patient is taking omeprazole and denies any issues with acid reflux-no epigastric pain, no burping or belching.  She is not currently taking a daily aspirin but had previously been taking 325 mg of aspirin twice daily in the past, usually for pain.  Joint pain has decreased after having bilateral knee replacements.  She has already obtained a 3158-month supply of her chronic medications to take with her.  She reports no significant issues at today's visit.  Past Medical History:  Diagnosis Date  . Arthritis   . Hypertension     Past Surgical History:  Procedure Laterality Date  . 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 TOTAL KNEE ARTHROPLASTY Left 05/28/2016   Procedure: TOTAL  KNEE ARTHROPLASTY;  Surgeon: Dannielle HuhSteve Lucey, MD;  Location: [facility] OR;  Service: Orthopedics;  Laterality: Left;    Family History  Problem Relation Age of Onset  . Hypertension Mother   . Hypertension Father     Social History   Socioeconomic History  . Marital status: Single    Spouse name: Not on file  . Number of children: Not on file  . Years of education: Not on file  . Highest education level: Not on file  Occupational History  . Not on file  Social Needs  . Financial resource strain: Not on file  . Food insecurity    Worry: Not on file    Inability: Not on file  . Transportation needs    Medical: Not on file    Non-medical: Not on file  Tobacco Use  . Smoking status: Never Smoker  . Smokeless tobacco: Never Used  Substance and Sexual Activity  . Alcohol use: No  . Drug use: No  . Sexual activity: Not Currently  Lifestyle  . Physical activity    Days per week: Not on file    Minutes per session: Not on file  . Stress: Not on file  Relationships  . Social Musicianconnections    Talks on phone: Not on file    Gets together: Not on file    Attends religious service: Not on file    Active member of club or organization: Not on file  Attends meetings of clubs or organizations: Not on file    Relationship status: Not on file  . Intimate partner violence    Fear of current or ex partner: Not on file    Emotionally abused: Not on file    Physically abused: Not on file    Forced sexual activity: Not on file  Other Topics Concern  . Not on file  Social History Narrative  . Not on file    Outpatient Medications Prior to Visit  Medication Sig Dispense Refill  . aspirin EC 325 MG EC tablet Take 1 tablet (325 mg total) by mouth 2 (two) times daily. (Patient not taking: Reported on 04/08/2019) 30 tablet 0  . atenolol (TENORMIN) 100 MG tablet Take 1 tablet (100 mg total) by mouth daily. To lower blood pressure 180 tablet 0  . atorvastatin (LIPITOR) 20 MG tablet Take 1 tablet (20  mg total) by mouth daily. To lower cholesterol 180 tablet 0  . cetirizine (ZYRTEC) 10 MG tablet Take 1 tablet (10 mg total) by mouth at bedtime. As needed for nasal congestion 180 tablet 0  . doxycycline (VIBRA-TABS) 100 MG tablet Take 1 tablet (100 mg total) by mouth 2 (two) times daily. 20 tablet 0  . lisinopril (ZESTRIL) 20 MG tablet Take 1 tablet (20 mg total) by mouth daily. To lower blood pressure 180 tablet 0  . methocarbamol (ROBAXIN) 500 MG tablet Take 1-2 tablets (500-1,000 mg total) by mouth every 6 (six) hours as needed for muscle spasms. (Patient not taking: Reported on 04/08/2019) 60 tablet 0  . omeprazole (PRILOSEC) 40 MG capsule Take 1 capsule (40 mg total) by mouth daily. To reduce stomach acid 180 capsule 0  . predniSONE (DELTASONE) 20 MG tablet Take 1 tablet (20 mg total) by mouth daily with breakfast. Take 2 pills by mouth once per day for 5 days; eat before taking the medicine 10 tablet 0   No facility-administered medications prior to visit.     No Known Allergies  ROS Review of Systems  Constitutional: Negative for chills, fatigue and fever.  HENT: Negative for sore throat and trouble swallowing.   Eyes: Negative for photophobia and visual disturbance.  Cardiovascular: Negative for chest pain, palpitations and leg swelling.  Gastrointestinal: Negative for abdominal pain, blood in stool, constipation, diarrhea and nausea.  Endocrine: Negative for polydipsia, polyphagia and polyuria.  Genitourinary: Negative for dysuria and frequency.  Musculoskeletal: Negative for arthralgias and back pain.  Neurological: Negative for dizziness and headaches.  Hematological: Negative for adenopathy. Does not bruise/bleed easily.      Objective:    Physical Exam  Constitutional: She is oriented to person, place, and time. She appears well-developed and well-nourished.  WNWD overweight older female in NAD who is accompanied by her son who acts as interpreter  Neck: Normal range of  motion. Neck supple. No JVD present.  Cardiovascular: Normal rate and regular rhythm.  Pulmonary/Chest: Effort normal and breath sounds normal.  Abdominal: Soft. There is no abdominal tenderness. There is no rebound and no guarding.  Musculoskeletal:        General: No tenderness or edema.  Lymphadenopathy:    She has no cervical adenopathy.  Neurological: She is alert and oriented to person, place, and time.  Skin: Skin is warm and dry.  Psychiatric: She has a normal mood and affect. Her behavior is normal.  Nursing note and vitals reviewed.  BP 132/88 (BP Location: Right Arm, Patient Position: Sitting, Cuff Size: Large)   Pulse 69  Temp 98.3 F (36.8 C) (Oral)   Ht 5' 5.5" (1.664 m)   Wt 224 lb 12.8 oz (102 kg)   SpO2 97%   BMI 36.84 kg/m   Wt Readings from Last 3 Encounters:  10/29/18 220 lb 9.6 oz (100.1 kg)  09/12/18 214 lb (97.1 kg)  05/28/16 207 lb 3.2 oz (94 kg)     Health Maintenance Due  Topic Date Due  . Hepatitis C Screening  11-18-1955  . HIV Screening  01/03/1969  . TETANUS/TDAP  01/03/1973  . PAP SMEAR-Modifier  01/04/1975  . MAMMOGRAM  01/04/2004  . COLONOSCOPY  01/04/2004  . DEXA SCAN  01/04/2019  . PNA vac Low Risk Adult (1 of 2 - PCV13) 01/04/2019  . INFLUENZA VACCINE  04/04/2019    *Patient will be leaving the country for approximately 6 months and therefore wishes to defer well female exam for Pap smear, mammogram, colonoscopy referral and DEXA scan  No results found for: TSH Lab Results  Component Value Date   WBC 6.9 09/07/2018   HGB 12.6 09/07/2018   HCT 40.6 09/07/2018   MCV 91.2 09/07/2018   PLT 252 09/07/2018   Lab Results  Component Value Date   NA 143 10/29/2018   K 4.3 10/29/2018   CO2 23 10/29/2018   GLUCOSE 92 10/29/2018   BUN 16 10/29/2018   CREATININE 1.02 (H) 10/29/2018   BILITOT 0.3 10/29/2018   ALKPHOS 56 10/29/2018   AST 15 10/29/2018   ALT 11 10/29/2018   PROT 7.5 10/29/2018   ALBUMIN 4.2 10/29/2018   CALCIUM  9.7 10/29/2018   ANIONGAP 9 09/07/2018   Lab Results  Component Value Date   CHOL 237 (H) 10/29/2018   Lab Results  Component Value Date   HDL 53 10/29/2018   Lab Results  Component Value Date   LDLCALC 163 (H) 10/29/2018   Lab Results  Component Value Date   TRIG 105 10/29/2018   Lab Results  Component Value Date   CHOLHDL 4.5 (H) 10/29/2018   Lab Results  Component Value Date   HGBA1C 5.9 (A) 09/12/2018      Assessment & Plan:  1. Essential hypertension Blood pressures controlled at today's visit on patient's current medications including Tenormin and lisinopril.  Patient will have electrolytes and creatinine checked as part of CMP.  Continue low-sodium diet and exercise as tolerated.  2. Prediabetes Will obtain hemoglobin A1c and comprehensive metabolic panel in follow-up of prediabetes.  Patient denies any urinary frequency, no increased thirst and no blurred vision at today's visit.  Last hemoglobin A1c was 5.9 on 09/12/2018 - Comprehensive metabolic panel - HgB A1c  3. Gastroesophageal reflux disease, esophagitis presence not specified Patient does not have any epigastric tenderness on examination and reports infrequent reflux symptoms.  Patient takes omeprazole on an as-needed basis  4. Hyperlipidemia, unspecified hyperlipidemia type Patient is currently on atorvastatin 20 mg daily.  She will have comprehensive metabolic panel in follow-up of long-term use of statin medication as well as lipid panel at today's visit.  Patient's last lipid panel was in February of this year and she had total cholesterol of 237 and LDL elevated at 163. - Comprehensive metabolic panel - Lipid panel  5. Need for immunization against influenza Patient was offered and agreed to have influenza immunization at today's visit and will receive educational information.  Patient will receive form to complete regarding receiving immunization at low cost or for free.   *Patient will have  chest x-ray done in  follow-up of her for recent chest congestion and cough which per patient/son have resolved.  Patient however is on lisinopril which can cause a recurrent dry cough therefore if patient has future complaint of recurrent cough with a normal exam then her lisinopril should be discontinued and she should be placed on other medications for hypertension to see if this improves the cough.  Patient's cough may also have been related to acid reflux symptoms and/or allergic rhinitis.  An After Visit Summary was printed and given to the patient.  Follow-up:  Return in about 6 months (around 10/23/2019) for chronic issues; out of country for about 6 months; needs well female exam.   Cain Saupeammie Kohana Amble, MD

## 2019-04-23 LAB — LIPID PANEL
Chol/HDL Ratio: 2.9 ratio (ref 0.0–4.4)
Cholesterol, Total: 146 mg/dL (ref 100–199)
HDL: 50 mg/dL
LDL Calculated: 83 mg/dL (ref 0–99)
Triglycerides: 65 mg/dL (ref 0–149)
VLDL Cholesterol Cal: 13 mg/dL (ref 5–40)

## 2019-04-23 LAB — COMPREHENSIVE METABOLIC PANEL WITH GFR
ALT: 7 IU/L (ref 0–32)
AST: 11 IU/L (ref 0–40)
Albumin/Globulin Ratio: 1.4 (ref 1.2–2.2)
Albumin: 4.2 g/dL (ref 3.8–4.8)
Alkaline Phosphatase: 63 IU/L (ref 39–117)
BUN/Creatinine Ratio: 15 (ref 12–28)
BUN: 14 mg/dL (ref 8–27)
Bilirubin Total: 0.3 mg/dL (ref 0.0–1.2)
CO2: 22 mmol/L (ref 20–29)
Calcium: 9.5 mg/dL (ref 8.7–10.3)
Chloride: 105 mmol/L (ref 96–106)
Creatinine, Ser: 0.92 mg/dL (ref 0.57–1.00)
GFR calc Af Amer: 76 mL/min/1.73
GFR calc non Af Amer: 66 mL/min/1.73
Globulin, Total: 3.1 g/dL (ref 1.5–4.5)
Glucose: 101 mg/dL — ABNORMAL HIGH (ref 65–99)
Potassium: 4.5 mmol/L (ref 3.5–5.2)
Sodium: 142 mmol/L (ref 134–144)
Total Protein: 7.3 g/dL (ref 6.0–8.5)

## 2019-10-15 ENCOUNTER — Telehealth: Payer: Self-pay | Admitting: Family Medicine

## 2019-10-15 NOTE — Telephone Encounter (Signed)
Patient son called and requested to get his mother medications refilled. Please fu at your earliest convenience. Patients son stated that she wont be back in [place] until April. He requested that all her meds be filled until she can come back and get an appointment. Hew stated that he leaves tomorrow to go out of the country.  [facility] [address] if sent.

## 2019-10-16 ENCOUNTER — Other Ambulatory Visit: Payer: Self-pay | Admitting: Family Medicine

## 2019-10-16 DIAGNOSIS — K219 Gastro-esophageal reflux disease without esophagitis: Secondary | ICD-10-CM

## 2019-10-16 DIAGNOSIS — I10 Essential (primary) hypertension: Secondary | ICD-10-CM

## 2019-10-16 DIAGNOSIS — E785 Hyperlipidemia, unspecified: Secondary | ICD-10-CM

## 2019-10-16 MED ORDER — OMEPRAZOLE 40 MG PO CPDR: 40.0000 mg | DELAYED_RELEASE_CAPSULE | Freq: Every day | ORAL | 0 refills | Status: DC

## 2019-10-16 MED ORDER — ATENOLOL 100 MG PO TABS: 100.0000 mg | ORAL_TABLET | Freq: Every day | ORAL | 0 refills | Status: DC

## 2019-10-16 MED ORDER — ATORVASTATIN CALCIUM 20 MG PO TABS: 20.0000 mg | ORAL_TABLET | Freq: Every day | ORAL | 0 refills | Status: DC

## 2019-10-16 MED ORDER — LISINOPRIL 20 MG PO TABS: 20.0000 mg | ORAL_TABLET | Freq: Every day | ORAL | 0 refills | Status: DC

## 2019-10-16 NOTE — Telephone Encounter (Signed)
Patient son called again requesting an update on the medication refill for his mother. [facility] [address]  In [place]. Please f/u   Pharmacy phone: [phone]

## 2019-10-16 NOTE — Telephone Encounter (Signed)
Patient is in [place] and son will be traveling there on Saturday. He is requesting medication for DM and BP be sent to pharmacy. Request enough to last her until she can return. Please send to [facility] [address] [place]

## 2019-10-16 NOTE — Telephone Encounter (Signed)
Patient son states that she wont be back in the [place] until April. They will make an appointment upon return.

## 2019-10-16 NOTE — Telephone Encounter (Signed)
90 day supply of medication was sent to requested pharmacy in [place], [place].

## 2019-10-16 NOTE — Progress Notes (Signed)
Patient ID: Savannah Reese, female   DOB: 09-01-1956, 64 y.o.   MRN: [id number]   Son left message for an additional refill of patient's medications as she is still in her home country and he will be traveling there on Saturday. I had nurse contact the son as he did not specify how long patient would be out of the country and per patient she is to be out of the country until April of this year. 90 days of medication sent to the pharmacy in the [place] per son's request but in the future, patient will need to establish with a preimary care provider in her home country for refills if she will be away for extended periods. This was discussed with a family member of the patient in the past at patient's last office visit.

## 2019-10-16 NOTE — Telephone Encounter (Signed)
How much longer will his mother be out of the country?

## 2020-03-17 ENCOUNTER — Ambulatory Visit: Payer: Self-pay | Admitting: Critical Care Medicine

## 2020-03-21 ENCOUNTER — Other Ambulatory Visit: Payer: Self-pay | Admitting: Critical Care Medicine

## 2020-03-21 ENCOUNTER — Encounter: Payer: Self-pay | Admitting: Critical Care Medicine

## 2020-03-21 ENCOUNTER — Ambulatory Visit: Payer: Self-pay | Attending: Critical Care Medicine | Admitting: Critical Care Medicine

## 2020-03-21 ENCOUNTER — Other Ambulatory Visit: Payer: Self-pay

## 2020-03-21 VITALS — BP 141/93 | HR 74 | Temp 97.0°F | Resp 17 | Wt 218.0 lb

## 2020-03-21 DIAGNOSIS — E782 Mixed hyperlipidemia: Secondary | ICD-10-CM

## 2020-03-21 DIAGNOSIS — I10 Essential (primary) hypertension: Secondary | ICD-10-CM

## 2020-03-21 DIAGNOSIS — E785 Hyperlipidemia, unspecified: Secondary | ICD-10-CM

## 2020-03-21 DIAGNOSIS — K219 Gastro-esophageal reflux disease without esophagitis: Secondary | ICD-10-CM

## 2020-03-21 DIAGNOSIS — R7303 Prediabetes: Secondary | ICD-10-CM

## 2020-03-21 DIAGNOSIS — Z6834 Body mass index (BMI) 34.0-34.9, adult: Secondary | ICD-10-CM

## 2020-03-21 DIAGNOSIS — E6609 Other obesity due to excess calories: Secondary | ICD-10-CM

## 2020-03-21 DIAGNOSIS — R05 Cough: Secondary | ICD-10-CM

## 2020-03-21 DIAGNOSIS — R053 Chronic cough: Secondary | ICD-10-CM | POA: Insufficient documentation

## 2020-03-21 LAB — POCT GLYCOSYLATED HEMOGLOBIN (HGB A1C)

## 2020-03-21 MED ORDER — ATENOLOL 100 MG PO TABS: 100.0000 mg | ORAL_TABLET | Freq: Every day | ORAL | 1 refills | Status: DC

## 2020-03-21 MED ORDER — DEXTROMETHORPHAN POLISTIREX ER 30 MG/5ML PO SUER: 30.0000 mg | Freq: Every evening | ORAL | 0 refills | Status: DC | PRN

## 2020-03-21 MED ORDER — LOSARTAN POTASSIUM-HCTZ 100-12.5 MG PO TABS
1.0000 | ORAL_TABLET | Freq: Every day | ORAL | 3 refills | Status: DC
Start: 2020-03-21 — End: 2020-11-29

## 2020-03-21 MED ORDER — ATORVASTATIN CALCIUM 20 MG PO TABS: 20.0000 mg | ORAL_TABLET | Freq: Every day | ORAL | 1 refills | Status: DC

## 2020-03-21 MED ORDER — OMEPRAZOLE 40 MG PO CPDR: 40.0000 mg | DELAYED_RELEASE_CAPSULE | Freq: Two times a day (BID) | ORAL | 1 refills | Status: DC

## 2020-03-21 MED FILL — LOSARTAN-HCTZ 100-12.5 MG T: 100-12.5 | 90 days supply | Qty: 90 | Fill #0

## 2020-03-21 MED FILL — ATENOLOL 100 MG TABLET: 100 | 90 days supply | Qty: 90 | Fill #0

## 2020-03-21 NOTE — Assessment & Plan Note (Signed)
We will follow-up lipid levels continue atorvastatin

## 2020-03-21 NOTE — Assessment & Plan Note (Signed)
Hypertension under poor control  Plan to discontinue lisinopril and begin losartan HCT 100/12.5 daily and follow-up metabolic panel and complete blood count  We will also continue Tenormin 100 mg daily refills were given

## 2020-03-21 NOTE — Patient Instructions (Addendum)
Stop lisinopril Start losartan HCT 100/12.5 one daily Stay on atovastatin daily Increase omeprazole to 40mg  twice a day before a meal Follow a reflux diet below Use Delsym OTC as needed at bedtime for cough  Labs today:  Metabolic panel, lipid panel, blood counts  Return Dr in October, will order mammogram on your return   Gastroesophageal Reflux Disease, Adult Gastroesophageal reflux (GER) happens when acid from the stomach flows up into the tube that connects the mouth and the stomach (esophagus). Normally, food travels down the esophagus and stays in the stomach to be digested. However, when a person has GER, food and stomach acid sometimes move back up into the esophagus. If this becomes a more serious problem, the person may be diagnosed with a disease called gastroesophageal reflux disease (GERD). GERD occurs when the reflux:  Happens often.  Causes frequent or severe symptoms.  Causes problems such as damage to the esophagus. When stomach acid comes in contact with the esophagus, the acid may cause soreness (inflammation) in the esophagus. Over time, GERD may create small holes (ulcers) in the lining of the esophagus. What are the causes? This condition is caused by a problem with the muscle between the esophagus and the stomach (lower esophageal sphincter, or LES). Normally, the LES muscle closes after food passes through the esophagus to the stomach. When the LES is weakened or abnormal, it does not close properly, and that allows food and stomach acid to go back up into the esophagus. The LES can be weakened by certain dietary substances, medicines, and medical conditions, including:  Tobacco use.  Pregnancy.  Having a hiatal hernia.  Alcohol use.  Certain foods and beverages, such as coffee, chocolate, onions, and peppermint. What increases the risk? You are more likely to develop this condition if you:  Have an increased body weight.  Have a connective tissue  disorder.  Use NSAID medicines. What are the signs or symptoms? Symptoms of this condition include:  Heartburn.  Difficult or painful swallowing.  The feeling of having a lump in the throat.  Abitter taste in the mouth.  Bad breath.  Having a large amount of saliva.  Having an upset or bloated stomach.  Belching.  Chest pain. Different conditions can cause chest pain. Make sure you see your health care provider if you experience chest pain.  Shortness of breath or wheezing.  Ongoing (chronic) cough or a night-time cough.  Wearing away of tooth enamel.  Weight loss. How is this diagnosed? Your health care provider will take a medical history and perform a physical exam. To determine if you have mild or severe GERD, your health care provider may also monitor how you respond to treatment. You may also have tests, including:  A test to examine your stomach and esophagus with a small camera (endoscopy).  A test thatmeasures the acidity level in your esophagus.  A test thatmeasures how much pressure is on your esophagus.  A barium swallow or modified barium swallow test to show the shape, size, and functioning of your esophagus. How is this treated? The goal of treatment is to help relieve your symptoms and to prevent complications. Treatment for this condition may vary depending on how severe your symptoms are. Your health care provider may recommend:  Changes to your diet.  Medicine.  Surgery. Follow these instructions at home: Eating and drinking   Follow a diet as recommended by your health care provider. This may involve avoiding foods and drinks such as: ?  Coffee and tea (with or without caffeine). ? Drinks that containalcohol. ? Energy drinks and sports drinks. ? Carbonated drinks or sodas. ? Chocolate and cocoa. ? Peppermint and mint flavorings. ? Garlic and onions. ? Horseradish. ? Spicy and acidic foods, including peppers, chili powder, curry  powder, vinegar, hot sauces, and barbecue sauce. ? Citrus fruit juices and citrus fruits, such as oranges, lemons, and limes. ? Tomato-based foods, such as red sauce, chili, salsa, and pizza with red sauce. ? Fried and fatty foods, such as donuts, french fries, potato chips, and high-fat dressings. ? High-fat meats, such as hot dogs and fatty cuts of red and white meats, such as rib eye steak, sausage, ham, and bacon. ? High-fat dairy items, such as whole milk, butter, and cream cheese.  Eat small, frequent meals instead of large meals.  Avoid drinking large amounts of liquid with your meals.  Avoid eating meals during the 2-3 hours before bedtime.  Avoid lying down right after you eat.  Do not exercise right after you eat. Lifestyle   Do not use any products that contain nicotine or tobacco, such as cigarettes, e-cigarettes, and chewing tobacco. If you need help quitting, ask your health care provider.  Try to reduce your stress by using methods such as yoga or meditation. If you need help reducing stress, ask your health care provider.  If you are overweight, reduce your weight to an amount that is healthy for you. Ask your health care provider for guidance about a safe weight loss goal. General instructions  Pay attention to any changes in your symptoms.  Take over-the-counter and prescription medicines only as told by your health care provider. Do not take aspirin, ibuprofen, or other NSAIDs unless your health care provider told you to do so.  Wear loose-fitting clothing. Do not wear anything tight around your waist that causes pressure on your abdomen.  Raise (elevate) the head of your bed about 6 inches (15 cm).  Avoid bending over if this makes your symptoms worse.  Keep all follow-up visits as told by your health care provider. This is important. Contact a health care provider if:  You have: ? New symptoms. ? Unexplained weight loss. ? Difficulty swallowing or it  hurts to swallow. ? Wheezing or a persistent cough. ? A hoarse voice.  Your symptoms do not improve with treatment. Get help right away if you:  Have pain in your arms, neck, jaw, teeth, or back.  Feel sweaty, dizzy, or light-headed.  Have chest pain or shortness of breath.  Vomit and your vomit looks like blood or coffee grounds.  Faint.  Have stool that is bloody or black.  Cannot swallow, drink, or eat. Summary  Gastroesophageal reflux happens when acid from the stomach flows up into the esophagus. GERD is a disease in which the reflux happens often, causes frequent or severe symptoms, or causes problems such as damage to the esophagus.  Treatment for this condition may vary depending on how severe your symptoms are. Your health care provider may recommend diet and lifestyle changes, medicine, or surgery.  Contact a health care provider if you have new or worsening symptoms.  Take over-the-counter and prescription medicines only as told by your health care provider. Do not take aspirin, ibuprofen, or other NSAIDs unless your health care provider told you to do so.  Keep all follow-up visits as told by your health care provider. This is important. This information is not intended to replace advice given to you by  your health care provider. Make sure you discuss any questions you have with your health care provider. Document Revised: 02/26/2018 Document Reviewed: 02/26/2018 Elsevier Patient Education  2020 [employer].

## 2020-03-21 NOTE — Assessment & Plan Note (Signed)
With obesity we did discuss the need for increasing the patient's exercise levels

## 2020-03-21 NOTE — Assessment & Plan Note (Signed)
Prediabetes with improved A1c down to 5.7 does not need medication for the diabetes at this time

## 2020-03-21 NOTE — Assessment & Plan Note (Signed)
Ongoing reflux disease we will increase omeprazole to twice daily and administer to the patient a reflux diet

## 2020-03-21 NOTE — Progress Notes (Signed)
Subjective:    Patient ID: Savannah Reese, female    DOB: 02/12/1956, 64 y.o.   MRN: [id number]  64 y.o.F here for f/u pcp.  Last seen 04/2019  HTN, GERD , pre diabetes This patient is wishing to reestablish primary care in this office was last seen in August 2020 with hypertension reflux disease and prediabetes.  The patient states she still having cough at night and reflux symptoms.  She does travel back and forth to her home country of [place].  She has received her vaccine for Covid.  The patient states she has been compliant with her blood pressure medicines and note recent numbers today are elevated at 140/93.  She is on the lisinopril and does note scratchiness in the throat area.  Patient also has hyperlipidemia and prediabetes but note hemoglobin A1c on arrival today was 5.7.  Note she plans to go back to [place] July 27 and stay for additional 3 to 4 months.  She is due a mammogram and wishes to wait until she returns from that visit.  She denies any other complaints at this time. Note the patient is due a metabolic profile blood count and lipid panel.    Past Medical History:  Diagnosis Date  . Arthritis   . Hypertension      Family History  Problem Relation Age of Onset  . Hypertension Mother   . Hypertension Father      Social History   Socioeconomic History  . Marital status: Single    Spouse name: Not on file  . Number of children: Not on file  . Years of education: Not on file  . Highest education level: Not on file  Occupational History  . Not on file  Tobacco Use  . Smoking status: Never Smoker  . Smokeless tobacco: Never Used  Vaping Use  . Vaping Use: Never used  Substance and Sexual Activity  . Alcohol use: No  . Drug use: No  . Sexual activity: Not Currently  Other Topics Concern  . Not on file  Social History Narrative  . Not on file   Social Determinants of Health   Financial Resource Strain:   . Difficulty of Paying Living Expenses:   Food  Insecurity:   . Worried About Programme researcher, broadcasting/film/video in the Last Year:   . Barista in the Last Year:   Transportation Needs:   . Freight forwarder (Medical):   Marland Kitchen Lack of Transportation (Non-Medical):   Physical Activity:   . Days of Exercise per Week:   . Minutes of Exercise per Session:   Stress:   . Feeling of Stress :   Social Connections:   . Frequency of Communication with Friends and Family:   . Frequency of Social Gatherings with Friends and Family:   . Attends Religious Services:   . Active Member of Clubs or Organizations:   . Attends Banker Meetings:   Marland Kitchen Marital Status:   Intimate Partner Violence:   . Fear of Current or Ex-Partner:   . Emotionally Abused:   Marland Kitchen Physically Abused:   . Sexually Abused:      No Known Allergies   Outpatient Medications Prior to Visit  Medication Sig Dispense Refill  . atenolol (TENORMIN) 100 MG tablet Take 1 tablet (100 mg total) by mouth daily. To lower blood pressure 90 tablet 0  . atorvastatin (LIPITOR) 20 MG tablet Take 1 tablet (20 mg total) by mouth daily. To lower cholesterol  90 tablet 0  . lisinopril (ZESTRIL) 20 MG tablet Take 1 tablet (20 mg total) by mouth daily. To lower blood pressure 90 tablet 0  . omeprazole (PRILOSEC) 40 MG capsule Take 1 capsule (40 mg total) by mouth daily. To reduce stomach acid 90 capsule 0  . aspirin EC 325 MG EC tablet Take 1 tablet (325 mg total) by mouth 2 (two) times daily. (Patient not taking: Reported on 04/08/2019) 30 tablet 0  . cetirizine (ZYRTEC) 10 MG tablet Take 1 tablet (10 mg total) by mouth at bedtime. As needed for nasal congestion 180 tablet 0   No facility-administered medications prior to visit.      Review of Systems  Constitutional: Negative.   HENT: Negative.   Eyes: Negative.   Respiratory: Positive for cough. Negative for apnea, choking, chest tightness, shortness of breath, wheezing and stridor.   Cardiovascular: Negative for chest pain,  palpitations and leg swelling.  Gastrointestinal: Positive for abdominal pain. Negative for abdominal distention, anal bleeding, blood in stool, constipation, diarrhea, nausea, rectal pain and vomiting.       Reflux symptoms nightly  Endocrine: Negative.   Musculoskeletal: Positive for arthralgias.       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   Neurological: Negative.   Hematological: Negative.   Psychiatric/Behavioral: Negative.        Objective:   Physical Exam Vitals:   03/21/20 0956  BP: (!) 141/93  Pulse: 74  Resp: 17  Temp: (!) 97 F (36.1 C)  TempSrc: Temporal  SpO2: 100%  Weight: 218 lb (98.9 kg)    Gen: Pleasant, obese, in no distress,  normal affect  ENT: No lesions,  mouth clear,  oropharynx clear, no postnasal drip  Neck: No JVD, no TMG, no carotid bruits  Lungs: No use of accessory muscles, no dullness to percussion, clear without rales or rhonchi  Cardiovascular: RRR, heart sounds normal, no murmur or gallops, no peripheral edema  Abdomen: soft and NT, no HSM,  BS normal  Musculoskeletal: No deformities, no cyanosis or clubbing  Neuro: alert, non focal  Skin: Warm, no lesions or rashes      Assessment & Plan:  I personally reviewed all images and lab data in the [facility] system as well as any outside material available during this office visit and agree with the  radiology impressions.   Essential hypertension Hypertension under poor control  Plan to discontinue lisinopril and begin losartan HCT 100/12.5 daily and follow-up metabolic panel and complete blood count  We will also continue Tenormin 100 mg daily refills were given  Gastroesophageal reflux disease without esophagitis Ongoing reflux disease we will increase omeprazole to twice daily and administer to the patient a reflux diet  Class 1 obesity due to excess calories with body mass index (BMI) of 34.0 to 34.9 in adult With obesity we did discuss the  need for increasing the patient's exercise levels  Mixed hyperlipidemia We will follow-up lipid levels continue atorvastatin  Prediabetes Prediabetes with improved A1c down to 5.7 does not need medication for the diabetes at this time  Chronic cough Chronic coughis likely of reflux disease and lisinopril  Discontinue lisinopril increased reflux medications administer reflux diet  Use Delsym as needed cough at sleep   Numa was seen today for prediabetes, hypertension and hyperlipidemia.  Diagnoses and all orders for this visit:  Prediabetes -     HgB A1c -     Comprehensive metabolic panel  Essential hypertension -  atenolol (TENORMIN) 100 MG tablet; Take 1 tablet (100 mg total) by mouth daily. To lower blood pressure -     Comprehensive metabolic panel -     CBC with Differential/Platelet  Mixed hyperlipidemia -     Lipid panel  Hyperlipidemia, unspecified hyperlipidemia type -     atorvastatin (LIPITOR) 20 MG tablet; Take 1 tablet (20 mg total) by mouth daily. To lower cholesterol -     Lipid panel  Gastroesophageal reflux disease -     omeprazole (PRILOSEC) 40 MG capsule; Take 1 capsule (40 mg total) by mouth 2 (two) times daily before a meal. To reduce stomach acid  Gastroesophageal reflux disease without esophagitis  Class 1 obesity due to excess calories with serious comorbidity and body mass index (BMI) of 34.0 to 34.9 in adult  Chronic cough  Other orders -     losartan-hydrochlorothiazide (HYZAAR) 100-12.5 MG tablet; Take 1 tablet by mouth daily. -     dextromethorphan (DELSYM) 30 MG/5ML liquid; Take 5 mLs (30 mg total) by mouth at bedtime as needed for cough.    Note a mammogram will be obtained when the patient returns from [place]

## 2020-03-21 NOTE — Assessment & Plan Note (Addendum)
Chronic coughis likely of reflux disease and lisinopril  Discontinue lisinopril increased reflux medications administer reflux diet  Use Delsym as needed cough at sleep

## 2020-03-22 LAB — COMPREHENSIVE METABOLIC PANEL
ALT: 9 IU/L (ref 0–32)
AST: 17 IU/L (ref 0–40)
Albumin/Globulin Ratio: 1.3 (ref 1.2–2.2)
Albumin: 4.5 g/dL (ref 3.8–4.8)
Alkaline Phosphatase: 69 IU/L (ref 48–121)
BUN/Creatinine Ratio: 22 (ref 12–28)
BUN: 23 mg/dL (ref 8–27)
Bilirubin Total: <0.2 mg/dL (ref 0.0–1.2)
CO2: 24 mmol/L (ref 20–29)
Calcium: 10.2 mg/dL (ref 8.7–10.3)
Chloride: 100 mmol/L (ref 96–106)
Creatinine, Ser: 1.06 mg/dL — ABNORMAL HIGH (ref 0.57–1.00)
GFR calc Af Amer: 63 mL/min/{1.73_m2} (ref >59)
GFR calc non Af Amer: 55 mL/min/{1.73_m2} — ABNORMAL LOW (ref >59)
Globulin, Total: 3.4 g/dL (ref 1.5–4.5)
Glucose: 120 mg/dL — ABNORMAL HIGH (ref 65–99)
Potassium: 4.4 mmol/L (ref 3.5–5.2)
Sodium: 137 mmol/L (ref 134–144)
Total Protein: 7.9 g/dL (ref 6.0–8.5)

## 2020-03-22 LAB — CBC WITH DIFFERENTIAL/PLATELET
Basophils Absolute: 0.1 10*3/uL (ref 0.0–0.2)
Basos: 1 %
EOS (ABSOLUTE): 0.2 10*3/uL (ref 0.0–0.4)
Eos: 2 %
Hematocrit: 40.8 % (ref 34.0–46.6)
Hemoglobin: 13.9 g/dL (ref 11.1–15.9)
Immature Grans (Abs): 0 10*3/uL (ref 0.0–0.1)
Immature Granulocytes: 0 %
Lymphocytes Absolute: 2.7 10*3/uL (ref 0.7–3.1)
Lymphs: 39 %
MCH: 29.6 pg (ref 26.6–33.0)
MCHC: 34.1 g/dL (ref 31.5–35.7)
MCV: 87 fL (ref 79–97)
Monocytes Absolute: 0.6 10*3/uL (ref 0.1–0.9)
Monocytes: 9 %
Neutrophils Absolute: 3.5 10*3/uL (ref 1.4–7.0)
Neutrophils: 49 %
Platelets: 238 10*3/uL (ref 150–450)
RBC: 4.7 x10E6/uL (ref 3.77–5.28)
RDW: 12.6 % (ref 11.7–15.4)
WBC: 7 10*3/uL (ref 3.4–10.8)

## 2020-03-22 LAB — LIPID PANEL
Chol/HDL Ratio: 4.6 ratio — ABNORMAL HIGH (ref 0.0–4.4)
Cholesterol, Total: 234 mg/dL — ABNORMAL HIGH (ref 100–199)
HDL: 51 mg/dL (ref >39)
LDL Chol Calc (NIH): 168 mg/dL — ABNORMAL HIGH (ref 0–99)
Triglycerides: 85 mg/dL (ref 0–149)
VLDL Cholesterol Cal: 15 mg/dL (ref 5–40)

## 2020-03-30 ENCOUNTER — Telehealth: Payer: Self-pay

## 2020-03-30 NOTE — Telephone Encounter (Signed)
At request of Dr Delford Field, call placed to patient's son, Helene Shoe # [phone] to discuss his questions about medicaid.  Voicemail was full, unable to leave a message.   As per provider's note, the patient was planning to go to [place] on 03/29/2020 for 3-4 months

## 2020-04-07 ENCOUNTER — Telehealth: Payer: Self-pay

## 2020-04-07 NOTE — Telephone Encounter (Signed)
Call placed to patient's son, Arbutus Ped. Explained to him that Dr Delford Field requested that I contact him about insurance options for his mother  He said that she is currently out of the country but will be returning. She does not have medicare and is interested in [place].  Explained to him that he needs to contact DSS # [phone] regarding medicaid. Provided him with the number for [facility] # [phone] to call regarding medicare and insurance options for seniors.   He then said that he needed to check with the pharmacy about a medication that she did not pick up before she left the country.  Instructed him to call the pharmacy in the morning as they are now closed. He said that he had the pharmacy phone number

## 2020-09-27 ENCOUNTER — Ambulatory Visit: Payer: Self-pay | Admitting: Critical Care Medicine

## 2020-09-27 NOTE — Progress Notes (Deleted)
Subjective:    Patient ID: Savannah Reese, female    DOB: 05/30/1956, 65 y.o.   MRN: [id number]  65 y.o.F here for f/u pcp.  Last seen 04/2019  HTN, GERD , pre diabetes This patient is wishing to reestablish primary care in this office was last seen in August 2020 with hypertension reflux disease and prediabetes.  The patient states she still having cough at night and reflux symptoms.  She does travel back and forth to her home country of [place].  She has received her vaccine for Covid.  The patient states she has been compliant with her blood pressure medicines and note recent numbers today are elevated at 140/93.  She is on the lisinopril and does note scratchiness in the throat area.  Patient also has hyperlipidemia and prediabetes but note hemoglobin A1c on arrival today was 5.7.  Note she plans to go back to [place] July 27 and stay for additional 3 to 4 months.  She is due a mammogram and wishes to wait until she returns from that visit.  She denies any other complaints at this time. Note the patient is due a metabolic profile blood count and lipid panel.  09/27/2020  Essential hypertension Hypertension under poor control  Plan to discontinue lisinopril and begin losartan HCT 100/12.5 daily and follow-up metabolic panel and complete blood count  We will also continue Tenormin 100 mg daily refills were given  Gastroesophageal reflux disease without esophagitis Ongoing reflux disease we will increase omeprazole to twice daily and administer to the patient a reflux diet  Class 1 obesity due to excess calories with body mass index (BMI) of 34.0 to 34.9 in adult With obesity we did discuss the need for increasing the patient's exercise levels  Mixed hyperlipidemia We will follow-up lipid levels continue atorvastatin  Prediabetes Prediabetes with improved A1c down to 5.7 does not need medication for the diabetes at this time  Chronic cough Chronic coughis likely of reflux disease and  lisinopril  Discontinue lisinopril increased reflux medications administer reflux diet  Use Delsym as needed cough at sleep   Savannah Reese was seen today for prediabetes, hypertension and hyperlipidemia.  Diagnoses and all orders for this visit:  Prediabetes -     HgB A1c -     Comprehensive metabolic panel  Essential hypertension -     atenolol (TENORMIN) 100 MG tablet; Take 1 tablet (100 mg total) by mouth daily. To lower blood pressure -     Comprehensive metabolic panel -     CBC with Differential/Platelet  Mixed hyperlipidemia -     Lipid panel  Hyperlipidemia, unspecified hyperlipidemia type -     atorvastatin (LIPITOR) 20 MG tablet; Take 1 tablet (20 mg total) by mouth daily. To lower cholesterol -     Lipid panel  Gastroesophageal reflux disease -     omeprazole (PRILOSEC) 40 MG capsule; Take 1 capsule (40 mg total) by mouth 2 (two) times daily before a meal. To reduce stomach acid  Gastroesophageal reflux disease without esophagitis  Class 1 obesity due to excess calories with serious comorbidity and body mass index (BMI) of 34.0 to 34.9 in adult  Chronic cough  Other orders -     losartan-hydrochlorothiazide (HYZAAR) 100-12.5 MG tablet; Take 1 tablet by mouth daily. -     dextromethorphan (DELSYM) 30 MG/5ML liquid; Take 5 mLs (30 mg total) by mouth at bedtime as needed for cough.    Note a mammogram will be obtained when the patient returns  from [place]    Past Medical History:  Diagnosis Date  . Arthritis   . Hypertension      Family History  Problem Relation Age of Onset  . Hypertension Mother   . Hypertension Father      Social History   Socioeconomic History  . Marital status: Single    Spouse name: Not on file  . Number of children: Not on file  . Years of education: Not on file  . Highest education level: Not on file  Occupational History  . Not on file  Tobacco Use  . Smoking status: Never Smoker  . Smokeless tobacco: Never Used   Vaping Use  . Vaping Use: Never used  Substance and Sexual Activity  . Alcohol use: No  . Drug use: No  . Sexual activity: Not Currently  Other Topics Concern  . Not on file  Social History Narrative  . Not on file   Social Determinants of Health   Financial Resource Strain: Not on file  Food Insecurity: Not on file  Transportation Needs: Not on file  Physical Activity: Not on file  Stress: Not on file  Social Connections: Not on file  Intimate Partner Violence: Not on file     No Known Allergies   Outpatient Medications Prior to Visit  Medication Sig Dispense Refill  . atenolol (TENORMIN) 100 MG tablet Take 1 tablet (100 mg total) by mouth daily. To lower blood pressure 90 tablet 1  . atorvastatin (LIPITOR) 20 MG tablet Take 1 tablet (20 mg total) by mouth daily. To lower cholesterol 90 tablet 1  . dextromethorphan (DELSYM) 30 MG/5ML liquid Take 5 mLs (30 mg total) by mouth at bedtime as needed for cough. 148 mL 0  . losartan-hydrochlorothiazide (HYZAAR) 100-12.5 MG tablet Take 1 tablet by mouth daily. 90 tablet 3  . omeprazole (PRILOSEC) 40 MG capsule Take 1 capsule (40 mg total) by mouth 2 (two) times daily before a meal. To reduce stomach acid 180 capsule 1   No facility-administered medications prior to visit.      Review of Systems  Constitutional: Negative.   HENT: Negative.   Eyes: Negative.   Respiratory: Positive for cough. Negative for apnea, choking, chest tightness, shortness of breath, wheezing and stridor.   Cardiovascular: Negative for chest pain, palpitations and leg swelling.  Gastrointestinal: Positive for abdominal pain. Negative for abdominal distention, anal bleeding, blood in stool, constipation, diarrhea, nausea, rectal pain and vomiting.       Reflux symptoms nightly  Endocrine: Negative.   Musculoskeletal: Positive for arthralgias.       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   Neurological: Negative.    Hematological: Negative.   Psychiatric/Behavioral: Negative.        Objective:   Physical Exam There were no vitals filed for this visit.  Gen: Pleasant, obese, in no distress,  normal affect  ENT: No lesions,  mouth clear,  oropharynx clear, no postnasal drip  Neck: No JVD, no TMG, no carotid bruits  Lungs: No use of accessory muscles, no dullness to percussion, clear without rales or rhonchi  Cardiovascular: RRR, heart sounds normal, no murmur or gallops, no peripheral edema  Abdomen: soft and NT, no HSM,  BS normal  Musculoskeletal: No deformities, no cyanosis or clubbing  Neuro: alert, non focal  Skin: Warm, no lesions or rashes      Assessment & Plan:  I personally reviewed all images and lab data in the [facility]  system as well as any outside material available during this office visit and agree with the  radiology impressions.   No problem-specific Assessment & Plan notes found for this encounter.   There are no diagnoses linked to this encounter.  Note a mammogram will be obtained when the patient returns from [place]

## 2020-10-05 MED FILL — ATENOLOL 100 MG TABLET: 100 | 90 days supply | Qty: 90 | Fill #1

## 2020-10-05 MED FILL — LOSARTAN-HCTZ 100-12.5 MG T: 100-12.5 | 30 days supply | Qty: 30 | Fill #1

## 2020-11-02 MED FILL — LOSARTAN-HCTZ 100-12.5 MG T: 100-12.5 | 30 days supply | Qty: 30 | Fill #2

## 2020-11-03 MED FILL — OMEPRAZOLE DR 40 MG CAPSULE: 40 | 30 days supply | Qty: 60 | Fill #0

## 2020-11-29 ENCOUNTER — Encounter: Payer: Self-pay | Admitting: Critical Care Medicine

## 2020-11-29 ENCOUNTER — Ambulatory Visit
Admission: RE | Admit: 2020-11-29 | Discharge: 2020-11-29 | Disposition: A | Discharge status: Home / Self Care | Payer: Self-pay | Source: Ambulatory Visit | Attending: Critical Care Medicine | Admitting: Critical Care Medicine

## 2020-11-29 ENCOUNTER — Ambulatory Visit: Payer: Self-pay | Attending: Critical Care Medicine | Admitting: Critical Care Medicine

## 2020-11-29 ENCOUNTER — Other Ambulatory Visit: Payer: Self-pay

## 2020-11-29 ENCOUNTER — Other Ambulatory Visit: Payer: Self-pay | Admitting: Critical Care Medicine

## 2020-11-29 VITALS — BP 152/99 | HR 85 | Resp 18 | Ht 67.0 in | Wt 213.4 lb

## 2020-11-29 DIAGNOSIS — R053 Chronic cough: Secondary | ICD-10-CM

## 2020-11-29 DIAGNOSIS — I10 Essential (primary) hypertension: Secondary | ICD-10-CM

## 2020-11-29 DIAGNOSIS — K219 Gastro-esophageal reflux disease without esophagitis: Secondary | ICD-10-CM

## 2020-11-29 DIAGNOSIS — E782 Mixed hyperlipidemia: Secondary | ICD-10-CM

## 2020-11-29 DIAGNOSIS — E785 Hyperlipidemia, unspecified: Secondary | ICD-10-CM

## 2020-11-29 DIAGNOSIS — Z8701 Personal history of pneumonia (recurrent): Secondary | ICD-10-CM

## 2020-11-29 DIAGNOSIS — Z139 Encounter for screening, unspecified: Secondary | ICD-10-CM

## 2020-11-29 DIAGNOSIS — Z1231 Encounter for screening mammogram for malignant neoplasm of breast: Secondary | ICD-10-CM

## 2020-11-29 MED ORDER — ATENOLOL 100 MG PO TABS: 100.0000 mg | ORAL_TABLET | Freq: Every day | ORAL | 1 refills | Status: DC

## 2020-11-29 MED ORDER — VALSARTAN-HYDROCHLOROTHIAZIDE 320-25 MG PO TABS
1.0000 | ORAL_TABLET | Freq: Every day | ORAL | 1 refills | Status: DC
Start: 2020-11-29 — End: 2020-11-29

## 2020-11-29 MED ORDER — ATORVASTATIN CALCIUM 20 MG PO TABS: 20.0000 mg | ORAL_TABLET | Freq: Every day | ORAL | 1 refills | Status: DC

## 2020-11-29 MED ORDER — OMEPRAZOLE 40 MG PO CPDR: 40.0000 mg | DELAYED_RELEASE_CAPSULE | Freq: Two times a day (BID) | ORAL | 1 refills | Status: DC

## 2020-11-29 MED FILL — OMEPRAZOLE DR 40 MG CAPSULE: 40 | 90 days supply | Qty: 180 | Fill #0

## 2020-11-29 MED FILL — VALSARTAN-HCTZ 320-25 MG TA: 320-25 | 30 days supply | Qty: 30 | Fill #0

## 2020-11-29 MED FILL — ATORVASTATIN CALCIUM 20 MG: 20 | 90 days supply | Qty: 90 | Fill #0

## 2020-11-29 NOTE — Assessment & Plan Note (Signed)
Reflux continues will need refills on omeprazole

## 2020-11-29 NOTE — Progress Notes (Signed)
Subjective:    Patient ID: Savannah Reese, female    DOB: [birth year], 65 y.o.   MRN: [id number]  65 y.o.F here for f/u pcp.  Last seen 04/2019  HTN, GERD , pre diabetes This patient is wishing to reestablish primary care in this office was last seen in August 2020 with hypertension reflux disease and prediabetes.  The patient states she still having cough at night and reflux symptoms.  She does travel back and forth to her home country of [place].  She has received her vaccine for Covid.  The patient states she has been compliant with her blood pressure medicines and note recent numbers today are elevated at 140/93.  She is on the lisinopril and does note scratchiness in the throat area.  Patient also has hyperlipidemia and prediabetes but note hemoglobin A1c on arrival today was 5.7.  Note she plans to go back to [place] July 27 and stay for additional 3 to 4 months.  She is due a mammogram and wishes to wait until she returns from that visit.  She denies any other complaints at this time. Note the patient is due a metabolic profile blood count and lipid panel.  11/29/2020 This patient is seen in return follow-up for primary care.  After I last saw her in July of last year she traveled to [place] to visit her family in [place] and was there until end of January of this year.  During that period of time she went up in the hospital with pneumonia and cough.  She states her cough is better pneumonia has resolved but she is yet to receive an x-ray since discharge.  On arrival blood pressure is 152/99.  It is apparent the patient has stopped all of her blood pressure medicine since traveling to [place] and did not have any replacements given while in [place]. She is supposed to be on Tenormin and losartan HCT but is only been starting back to losartan HCT since returning to the states a month ago.  Patient does have some chronic left hip pain otherwise no other complaints.  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  Past Medical History:  Diagnosis Date  . Arthritis   . Hypertension      Family History  Problem Relation Age of Onset  . Hypertension Mother   . Hypertension Father      Social History   Socioeconomic History  . Marital status: Single    Spouse name: Not on file  . Number of children: Not on file  . Years of education: Not on file  . Highest education level: Not on file  Occupational History  . Not on file  Tobacco Use  . Smoking status: Never Smoker  . Smokeless tobacco: Never Used  Vaping Use  . Vaping Use: Never used  Substance and Sexual Activity  . Alcohol use: No  . Drug use: No  . Sexual activity: Not Currently  Other Topics Concern  . Not on file  Social History Narrative  . Not on file   Social Determinants of Health   Financial Resource Strain: Not on file  Food Insecurity: Not on file  Transportation Needs: Not on file  Physical Activity: Not on file  Stress: Not on file  Social Connections: Not on file  Intimate Partner Violence: Not on file     No Known Allergies   Outpatient Medications Prior to Visit  Medication Sig Dispense Refill  . losartan-hydrochlorothiazide (HYZAAR) 100-12.5 MG tablet  Take 1 tablet by mouth daily. 90 tablet 3  . atenolol (TENORMIN) 100 MG tablet Take 1 tablet (100 mg total) by mouth daily. To lower blood pressure (Patient not taking: Reported on 11/29/2020) 90 tablet 1  . atorvastatin (LIPITOR) 20 MG tablet Take 1 tablet (20 mg total) by mouth daily. To lower cholesterol (Patient not taking: Reported on 11/29/2020) 90 tablet 1  . dextromethorphan (DELSYM) 30 MG/5ML liquid Take 5 mLs (30 mg total) by mouth at bedtime as needed for cough. (Patient not taking: Reported on 11/29/2020) 148 mL 0  . omeprazole (PRILOSEC) 40 MG capsule Take 1 capsule (40 mg total) by mouth 2 (two) times daily before a meal. To reduce stomach acid 180 capsule 1   No facility-administered medications prior to visit.       Review of Systems  Constitutional: Negative.   HENT: Negative.   Eyes: Negative.   Respiratory: Negative for apnea, cough, choking, chest tightness, shortness of breath, wheezing and stridor.   Cardiovascular: Negative for chest pain, palpitations and leg swelling.  Gastrointestinal: Negative for abdominal distention, abdominal pain, anal bleeding, blood in stool, constipation, diarrhea, nausea, rectal pain and vomiting.       Reflux symptoms nightly  Endocrine: Negative.   Musculoskeletal: Positive for arthralgias and back pain.       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   Neurological: Negative.   Hematological: Negative.   Psychiatric/Behavioral: Negative.        Objective:   Physical Exam Vitals:   11/29/20 1124  BP: (!) 152/99  Pulse: 85  Resp: 18  SpO2: 99%  Weight: 213 lb 6.4 oz (96.8 kg)  Height: 5\' 7"  (1.702 m)    Gen: Pleasant, obese, in no distress,  normal affect  ENT: No lesions,  mouth clear,  oropharynx clear, no postnasal drip  Neck: No JVD, no TMG, no carotid bruits  Lungs: No use of accessory muscles, no dullness to percussion, clear without rales or rhonchi  Cardiovascular: RRR, heart sounds normal, no murmur or gallops, no peripheral edema  Abdomen: soft and NT, no HSM,  BS normal  Musculoskeletal: No deformities, no cyanosis or clubbing  Neuro: alert, non focal  Skin: Warm, no lesions or rashes      Assessment & Plan:  I personally reviewed all images and lab data in the [facility] system as well as any outside material available during this office visit and agree with the  radiology impressions.   Essential hypertension Hypertension not well controlled  Plan is to refill atenolol 100 mg daily and to discontinue losartan HCT and begin valsartan HCT 320/25 1 daily We will obtain a set of electrolytes as well   Gastroesophageal reflux disease without esophagitis Reflux continues will need  refills on omeprazole  Chronic cough Chronic cough with prior history of pneumonia multifocal while in [facility]  Obtain chest x-ray  Mixed hyperlipidemia Recheck lipid panel obtain refill on atorvastatin   [place] was seen today for hypertension.  Diagnoses and all orders for this visit:  Encounter for screening mammogram for malignant neoplasm of breast -     MM DIGITAL SCREENING BILATERAL; Future  Essential hypertension -     atenolol (TENORMIN) 100 MG tablet; Take 1 tablet (100 mg total) by mouth daily. To lower blood pressure -     Comprehensive metabolic panel -     CBC with Differential/Platelet -     Thyroid Panel With TSH  Hyperlipidemia, unspecified hyperlipidemia type -  atorvastatin (LIPITOR) 20 MG tablet; Take 1 tablet (20 mg total) by mouth daily. To lower cholesterol -     Lipid panel  Gastroesophageal reflux disease -     omeprazole (PRILOSEC) 40 MG capsule; Take 1 capsule (40 mg total) by mouth 2 (two) times daily before a meal. To reduce stomach acid  Encounter for health-related screening -     Hemoglobin A1c  Chronic cough -     DG Chest 2 View; Future  History of pneumonia -     DG Chest 2 View; Future  Gastroesophageal reflux disease without esophagitis  Mixed hyperlipidemia  Other orders -     valsartan-hydrochlorothiazide (DIOVAN-HCT) 320-25 MG tablet; Take 1 tablet by mouth daily.    Note a mammogram will be obtained

## 2020-11-29 NOTE — Patient Instructions (Addendum)
Obtain a chest x-ray go to [facility] for this staff will give you directions  Refills on all medications sent to our pharmacy Stop losartan HCT and begin valsartan HCT 1 daily Resume atenolol daily for blood pressure Resume your cholesterol medication and your acid medication  Labs today include metabolic panel blood counts cholesterol levels thyroid function hemoglobin A1c  Return to see Dr. Delford Field in 2 months  Follow a healthy diet as outlined below  Obtenga una radiografa de trax, dirjase al [facility] para que este personal le d instrucciones.  Resurtidos de [employer] enviados a nuestra farmacia Suspender losartn HCT y Primary school teacher HCT 1 da Reanudar atenolol diariamente para la presin arterial Reanude su medicacin para el colesterol y su medicacin para el cido  Los laboratorios de hoy incluyen panel metablico, recuentos sanguneos, niveles de colesterol, funcin tiroidea, hemoglobina A1c.  Volver a ver al Dr. Delford Field en 2 meses  Siga una dieta saludable como se describe a continuacin.  https://www.mata.com/.pdf">  DASH Eating Plan DASH stands for Dietary Approaches to Stop Hypertension. The DASH eating plan is a healthy eating plan that has been shown to:  Reduce high blood pressure (hypertension).  Reduce your risk for type 2 diabetes, heart disease, and stroke.  Help with weight loss. What are tips for following this plan? Reading food labels  Check food labels for the amount of salt (sodium) per serving. Choose foods with less than 5 percent of the Daily Value of sodium. Generally, foods with less than 300 milligrams (mg) of sodium per serving fit into this eating plan.  To find whole grains, look for the word "whole" as the first word in the ingredient list. Shopping  Buy products labeled as "low-sodium" or "no salt added."  Buy fresh foods. Avoid canned foods and  pre-made or frozen meals. Cooking  Avoid adding salt when cooking. Use salt-free seasonings or herbs instead of table salt or sea salt. Check with your health care provider or pharmacist before using salt substitutes.  Do not fry foods. Cook foods using healthy methods such as baking, boiling, grilling, roasting, and broiling instead.  Cook with heart-healthy oils, such as olive, canola, avocado, soybean, or sunflower oil. Meal planning  Eat a balanced diet that includes: ? 4 or more servings of fruits and 4 or more servings of vegetables each day. Try to fill one-half of your plate with fruits and vegetables. ? 6-8 servings of whole grains each day. ? Less than 6 oz (170 g) of lean meat, poultry, or fish each day. A 3-oz (85-g) serving of meat is about the same size as a deck of cards. One egg equals 1 oz (28 g). ? 2-3 servings of low-fat dairy each day. One serving is 1 cup (237 mL). ? 1 serving of nuts, seeds, or beans 5 times each week. ? 2-3 servings of heart-healthy fats. Healthy fats called omega-3 fatty acids are found in foods such as walnuts, flaxseeds, fortified milks, and eggs. These fats are also found in cold-water fish, such as sardines, salmon, and mackerel.  Limit how much you eat of: ? Canned or prepackaged foods. ? Food that is high in trans fat, such as some fried foods. ? Food that is high in saturated fat, such as fatty meat. ? Desserts and other sweets, sugary drinks, and other foods with added sugar. ? Full-fat dairy products.  Do not salt foods before eating.  Do not eat more than 4 egg yolks a week.  Try to eat at least 2 vegetarian meals a week.  Eat more home-cooked food and less restaurant, buffet, and fast food.   Lifestyle  When eating at a restaurant, ask that your food be prepared with less salt or no salt, if possible.  If you drink alcohol: ? Limit how much you use to:  0-1 drink a day for women who are not pregnant.  0-2 drinks a day for  men. ? Be aware of how much alcohol is in your drink. In the [place] one drink equals one 12 oz bottle of beer (355 mL), one 5 oz glass of wine (148 mL), or one 1 oz glass of hard liquor (44 mL). General information  Avoid eating more than 2,300 mg of salt a day. If you have hypertension, you may need to reduce your sodium intake to 1,500 mg a day.  Work with your health care provider to maintain a healthy body weight or to lose weight. Ask what an ideal weight is for you.  Get at least 30 minutes of exercise that causes your heart to beat faster (aerobic exercise) most days of the week. Activities may include walking, swimming, or biking.  Work with your health care provider or dietitian to adjust your eating plan to your individual calorie needs. What foods should I eat? Fruits All fresh, dried, or frozen fruit. Canned fruit in natural juice (without added sugar). Vegetables Fresh or frozen vegetables (raw, steamed, roasted, or grilled). Low-sodium or reduced-sodium tomato and vegetable juice. Low-sodium or reduced-sodium tomato sauce and tomato paste. Low-sodium or reduced-sodium canned vegetables. Grains Whole-grain or whole-wheat bread. Whole-grain or whole-wheat pasta. Brown rice. Orpah Cobb. Bulgur. Whole-grain and low-sodium cereals. Pita bread. Low-fat, low-sodium crackers. Whole-wheat flour tortillas. Meats and other proteins Skinless chicken or [place]. Ground chicken or [place]. Pork with fat trimmed off. Fish and seafood. Egg whites. Dried beans, peas, or lentils. Unsalted nuts, nut butters, and seeds. Unsalted canned beans. Lean cuts of beef with fat trimmed off. Low-sodium, lean precooked or cured meat, such as sausages or meat loaves. Dairy Low-fat (1%) or fat-free (skim) milk. Reduced-fat, low-fat, or fat-free cheeses. Nonfat, low-sodium ricotta or cottage cheese. Low-fat or nonfat yogurt. Low-fat, low-sodium cheese. Fats and oils Soft margarine without trans fats. Vegetable  oil. Reduced-fat, low-fat, or light mayonnaise and salad dressings (reduced-sodium). Canola, safflower, olive, avocado, soybean, and sunflower oils. Avocado. Seasonings and condiments Herbs. Spices. Seasoning mixes without salt. Other foods Unsalted popcorn and pretzels. Fat-free sweets. The items listed above may not be a complete list of foods and beverages you can eat. Contact a dietitian for more information. What foods should I avoid? Fruits Canned fruit in a light or heavy syrup. Fried fruit. Fruit in cream or butter sauce. Vegetables Creamed or fried vegetables. Vegetables in a cheese sauce. Regular canned vegetables (not low-sodium or reduced-sodium). Regular canned tomato sauce and paste (not low-sodium or reduced-sodium). Regular tomato and vegetable juice (not low-sodium or reduced-sodium). Rosita Fire. Olives. Grains Baked goods made with fat, such as croissants, muffins, or some breads. Dry pasta or rice meal packs. Meats and other proteins Fatty cuts of meat. Ribs. Fried meat. Tomasa Blase. Bologna, salami, and other precooked or cured meats, such as sausages or meat loaves. Fat from the back of a pig (fatback). Bratwurst. Salted nuts and seeds. Canned beans with added salt. Canned or smoked fish. Whole eggs or egg yolks. Chicken or [place] with skin. Dairy Whole or 2% milk, cream, and half-and-half. Whole or full-fat cream cheese. Whole-fat or  sweetened yogurt. Full-fat cheese. Nondairy creamers. Whipped toppings. Processed cheese and cheese spreads. Fats and oils Butter. Stick margarine. Lard. Shortening. Ghee. Bacon fat. Tropical oils, such as coconut, palm kernel, or palm oil. Seasonings and condiments Onion salt, garlic salt, seasoned salt, table salt, and sea salt. Worcestershire sauce. Tartar sauce. Barbecue sauce. Teriyaki sauce. Soy sauce, including reduced-sodium. Steak sauce. Canned and packaged gravies. Fish sauce. Oyster sauce. Cocktail sauce. Store-bought horseradish. Ketchup.  Mustard. Meat flavorings and tenderizers. Bouillon cubes. Hot sauces. Pre-made or packaged marinades. Pre-made or packaged taco seasonings. Relishes. Regular salad dressings. Other foods Salted popcorn and pretzels. The items listed above may not be a complete list of foods and beverages you should avoid. Contact a dietitian for more information. Where to find more information  National Heart, Lung, and Blood Institute: PopSteam.is  American Heart Association: www.heart.org  Academy of Nutrition and Dietetics: www.eatright.org  National Kidney Foundation: www.kidney.org Summary  The DASH eating plan is a healthy eating plan that has been shown to reduce high blood pressure (hypertension). It may also reduce your risk for type 2 diabetes, heart disease, and stroke.  When on the DASH eating plan, aim to eat more fresh fruits and vegetables, whole grains, lean proteins, low-fat dairy, and heart-healthy fats.  With the DASH eating plan, you should limit salt (sodium) intake to 2,300 mg a day. If you have hypertension, you may need to reduce your sodium intake to 1,500 mg a day.  Work with your health care provider or dietitian to adjust your eating plan to your individual calorie needs. This information is not intended to replace advice given to you by your health care provider. Make sure you discuss any questions you have with your health care provider. Document Revised: 07/24/2019 Document Reviewed: 07/24/2019 Elsevier Patient Education  2021 [employer].

## 2020-11-29 NOTE — Assessment & Plan Note (Signed)
Recheck lipid panel obtain refill on atorvastatin

## 2020-11-29 NOTE — Assessment & Plan Note (Signed)
Hypertension not well controlled  Plan is to refill atenolol 100 mg daily and to discontinue losartan HCT and begin valsartan HCT 320/25 1 daily We will obtain a set of electrolytes as well

## 2020-11-29 NOTE — Assessment & Plan Note (Signed)
Chronic cough with prior history of pneumonia multifocal while in [place]  Obtain chest x-ray

## 2020-11-30 ENCOUNTER — Telehealth: Payer: Self-pay

## 2020-11-30 LAB — COMPREHENSIVE METABOLIC PANEL
ALT: 10 IU/L (ref 0–32)
AST: 16 IU/L (ref 0–40)
Albumin/Globulin Ratio: 1.2 (ref 1.2–2.2)
Albumin: 4.2 g/dL (ref 3.8–4.8)
Alkaline Phosphatase: 57 IU/L (ref 44–121)
BUN/Creatinine Ratio: 15 (ref 12–28)
BUN: 12 mg/dL (ref 8–27)
Bilirubin Total: 0.3 mg/dL (ref 0.0–1.2)
CO2: 23 mmol/L (ref 20–29)
Calcium: 9.5 mg/dL (ref 8.7–10.3)
Chloride: 104 mmol/L (ref 96–106)
Creatinine, Ser: 0.81 mg/dL (ref 0.57–1.00)
Globulin, Total: 3.5 g/dL (ref 1.5–4.5)
Glucose: 85 mg/dL (ref 65–99)
Potassium: 4.4 mmol/L (ref 3.5–5.2)
Sodium: 142 mmol/L (ref 134–144)
Total Protein: 7.7 g/dL (ref 6.0–8.5)
eGFR: 80 mL/min/{1.73_m2} (ref >59)

## 2020-11-30 LAB — HEMOGLOBIN A1C
Est. average glucose Bld gHb Est-mCnc: 123 mg/dL
Hgb A1c MFr Bld: 5.9 % — ABNORMAL HIGH (ref 4.8–5.6)

## 2020-11-30 LAB — LIPID PANEL
Chol/HDL Ratio: 3.7 ratio (ref 0.0–4.4)
Cholesterol, Total: 207 mg/dL — ABNORMAL HIGH (ref 100–199)
HDL: 56 mg/dL (ref >39)
LDL Chol Calc (NIH): 139 mg/dL — ABNORMAL HIGH (ref 0–99)
Triglycerides: 66 mg/dL (ref 0–149)
VLDL Cholesterol Cal: 12 mg/dL (ref 5–40)

## 2020-11-30 LAB — CBC WITH DIFFERENTIAL/PLATELET
Basophils Absolute: 0.1 10*3/uL (ref 0.0–0.2)
Basos: 1 %
EOS (ABSOLUTE): 0.1 10*3/uL (ref 0.0–0.4)
Eos: 2 %
Hematocrit: 43.2 % (ref 34.0–46.6)
Hemoglobin: 14 g/dL (ref 11.1–15.9)
Immature Grans (Abs): 0 10*3/uL (ref 0.0–0.1)
Immature Granulocytes: 0 %
Lymphocytes Absolute: 2.2 10*3/uL (ref 0.7–3.1)
Lymphs: 40 %
MCH: 28.6 pg (ref 26.6–33.0)
MCHC: 32.4 g/dL (ref 31.5–35.7)
MCV: 88 fL (ref 79–97)
Monocytes Absolute: 0.3 10*3/uL (ref 0.1–0.9)
Monocytes: 6 %
Neutrophils Absolute: 2.8 10*3/uL (ref 1.4–7.0)
Neutrophils: 51 %
Platelets: 252 10*3/uL (ref 150–450)
RBC: 4.89 x10E6/uL (ref 3.77–5.28)
RDW: 14.1 % (ref 11.7–15.4)
WBC: 5.4 10*3/uL (ref 3.4–10.8)

## 2020-11-30 LAB — THYROID PANEL WITH TSH
Free Thyroxine Index: 2.9 (ref 1.2–4.9)
T3 Uptake Ratio: 30 % (ref 24–39)
T4, Total: 9.7 ug/dL (ref 4.5–12.0)
TSH: 1.11 u[IU]/mL (ref 0.450–4.500)

## 2020-11-30 NOTE — Telephone Encounter (Signed)
-----   Message from Storm Frisk, MD sent at 11/30/2020  8:28 AM EDT ----- Let the patient know CXR is normal

## 2020-11-30 NOTE — Telephone Encounter (Signed)
-----   Message from Patrick E Wright, MD sent at 11/30/2020  8:28 AM EDT ----- Let the patient know CXR is normal 

## 2020-11-30 NOTE — Telephone Encounter (Signed)
Caller returning call regarding the below.

## 2020-11-30 NOTE — Telephone Encounter (Signed)
Informed pt of lab results pt verbalized understanding no other concerns voiced.

## 2020-11-30 NOTE — Telephone Encounter (Signed)
Attempted to call pt no VM setup. Will try to call pt again later.  

## 2020-11-30 NOTE — Telephone Encounter (Signed)
Attempted to call pt no VM setup. Will try to call pt again later.

## 2020-11-30 NOTE — Telephone Encounter (Signed)
-----   Message from Patrick E Wright, MD sent at 11/30/2020  8:34 AM EDT ----- Let pt know kidney liver normal,  blood counts normal, no diabetes , cholesterol slightly elevated, stay on atorvastatin 

## 2020-11-30 NOTE — Telephone Encounter (Signed)
-----   Message from Storm Frisk, MD sent at 11/30/2020  8:34 AM EDT ----- Let pt know kidney liver normal,  blood counts normal, no diabetes , cholesterol slightly elevated, stay on atorvastatin

## 2020-11-30 NOTE — Telephone Encounter (Signed)
Spoke with pt regarding CXR verbalized understanding no other concerns voiced.

## 2020-12-27 ENCOUNTER — Other Ambulatory Visit: Payer: Self-pay

## 2020-12-27 MED FILL — Omeprazole Cap Delayed Release 40 MG: ORAL | 30 days supply | Qty: 60 | Fill #0 | Status: AC

## 2020-12-27 MED FILL — Valsartan-Hydrochlorothiazide Tab 320-25 MG: ORAL | 30 days supply | Qty: 30 | Fill #0 | Status: AC

## 2021-01-02 DIAGNOSIS — M5442 Lumbago with sciatica, left side: Secondary | ICD-10-CM | POA: Insufficient documentation

## 2021-01-24 ENCOUNTER — Other Ambulatory Visit: Payer: Self-pay

## 2021-01-24 MED FILL — Valsartan-Hydrochlorothiazide Tab 320-25 MG: ORAL | 30 days supply | Qty: 30 | Fill #1 | Status: AC

## 2021-01-25 ENCOUNTER — Other Ambulatory Visit: Payer: Self-pay

## 2021-01-30 NOTE — Progress Notes (Signed)
Subjective:    Patient ID: Savannah Reese, female    DOB: 08-25-1956, 65 y.o.   MRN: [id number]   64 y.o.F here for f/u pcp.  Last seen 04/2019  HTN, GERD , pre diabetes This patient is wishing to reestablish primary care in this office was last seen in August 2020 with hypertension reflux disease and prediabetes.  The patient states she still having cough at night and reflux symptoms.  She does travel back and forth to her home country of [place].  She has received her vaccine for Covid.  The patient states she has been compliant with her blood pressure medicines and note recent numbers today are elevated at 140/93.  She is on the lisinopril and does note scratchiness in the throat area.  Patient also has hyperlipidemia and prediabetes but note hemoglobin A1c on arrival today was 5.7.  Note she plans to go back to [place] July 27 and stay for additional 3 to 4 months.  She is due a mammogram and wishes to wait until she returns from that visit.  She denies any other complaints at this time. Note the patient is due a metabolic profile blood count and lipid panel.  11/29/2020 This patient is seen in return follow-up for primary care.  After I last saw her in July of last year she traveled to [place] to visit her family in [place] and was there until end of January of this year.  During that period of time she went up in the hospital with pneumonia and cough.  She states her cough is better pneumonia has resolved but she is yet to receive an x-ray since discharge.  On arrival blood pressure is 152/99.  It is apparent the patient has stopped all of her blood pressure medicine since traveling to [place] and did not have any replacements given while in [place]. She is supposed to be on Tenormin and losartan HCT but is only been starting back to losartan HCT since returning to the states a month ago.  Patient does have some chronic left hip pain otherwise no other complaints.  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  5/31 Patient presents to primary care for follow up of hypertension, left hip pain, GERD, hyperlipidemia, and chronic cough. The patient states she has been taking her atorvastatin 20 mg daily, Diovan-HCT 320-25 mg daily, and Prilosec 40 mg twice daily as prescribed. She states she has not been taking the atenolol 100 mg daily since the last visit because she thought this prescription had been discontinued.  The patient reports she was seen by orthopedics and neurosurgery for her left leg and left hip pain. She states she had an Xray of the left hip which was ordered by the orthopedic doctor which was normal. The neurosurgeonr told her he did not feel she needed any more imaging or follow up with him due to the mild and intermittent nature of her symptoms. The patient states the pain in her left hip and leg only occurs occasionally and is "not too bad." She is able to walk well and is not experiencing any pain in these areas today.   The patient also reports bilateral hand pain for the past 4-5 days. The pain is intermittent and mild in severity. It occurs in all joints in her fingers. She is sometimes able to reproduce the pain when she moves her fingers.  The patient also has a chronic cough which she has been experiencing since having pneumonia in  the past. Recently the patient has been experiencing nasal congestion which she feels has been associated with her cough.   Patient declines COVID booster today. Patient feels her last Tdap vaccine was over 10 years ago. Pt agrees to Tdap today The patient agrees to have her next shingles vaccine at her next visit.   At the patient's previous visit a screening mammogram was ordered for the patient but was not scheduled. The patient agrees to have a screening mammogram once this is scheduled.   Past Medical History:  Diagnosis Date  . Arthritis   . Hypertension      Family History  Problem Relation Age of Onset  . Hypertension  Mother   . Hypertension Father      Social History   Socioeconomic History  . Marital status: Single    Spouse name: Not on file  . Number of children: Not on file  . Years of education: Not on file  . Highest education level: Not on file  Occupational History  . Not on file  Tobacco Use  . Smoking status: Never Smoker  . Smokeless tobacco: Never Used  Vaping Use  . Vaping Use: Never used  Substance and Sexual Activity  . Alcohol use: No  . Drug use: No  . Sexual activity: Not Currently  Other Topics Concern  . Not on file  Social History Narrative  . Not on file   Social Determinants of Health   Financial Resource Strain: Not on file  Food Insecurity: Not on file  Transportation Needs: Not on file  Physical Activity: Not on file  Stress: Not on file  Social Connections: Not on file  Intimate Partner Violence: Not on file     No Known Allergies   Outpatient Medications Prior to Visit  Medication Sig Dispense Refill  . atorvastatin (LIPITOR) 20 MG tablet TAKE 1 TABLET (20 MG TOTAL) BY MOUTH DAILY. TO LOWER CHOLESTEROL 90 tablet 1  . omeprazole (PRILOSEC) 40 MG capsule TAKE 1 CAPSULE (40 MG TOTAL) BY MOUTH 2 (TWO) TIMES DAILY BEFORE A MEAL. TO REDUCE STOMACH ACID 180 capsule 1  . valsartan-hydrochlorothiazide (DIOVAN-HCT) 320-25 MG tablet TAKE 1 TABLET BY MOUTH DAILY. 90 tablet 1  . atenolol (TENORMIN) 100 MG tablet TAKE 1 TABLET (100 MG TOTAL) BY MOUTH DAILY. TO LOWER BLOOD PRESSURE (Patient not taking: Reported on 01/31/2021) 90 tablet 1   No facility-administered medications prior to visit.      Review of Systems  Constitutional: Negative.   HENT: Positive for congestion.   Eyes: Negative.   Respiratory: Positive for cough. Negative for apnea, choking, chest tightness, shortness of breath, wheezing and stridor.   Cardiovascular: Negative for chest pain, palpitations and leg swelling.  Gastrointestinal: Negative.  Negative for abdominal distention, abdominal  pain, anal bleeding, blood in stool, constipation, diarrhea, nausea, rectal pain and vomiting.       Reflux symptoms nightly  Endocrine: Negative.   Musculoskeletal: Positive for arthralgias and back pain.       Chronic bilateral knee pain from bilateral knee replacement. Chronic left hip and left leg pain. Bilateral hand pain  Skin: Negative.   Neurological: Negative.   Hematological: Negative.   Psychiatric/Behavioral: Negative.        Objective:   Physical Exam Constitutional:      General: She is not in acute distress.    Appearance: She is obese.  HENT:     Head: Normocephalic and atraumatic.     Nose: Congestion present.  Comments: Swollen erythematous turbinates bilaterally, no purulence    Mouth/Throat:     Mouth: Mucous membranes are moist.     Pharynx: Oropharynx is clear.  Cardiovascular:     Rate and Rhythm: Normal rate and regular rhythm.     Heart sounds: Normal heart sounds. No murmur heard. No gallop.   Pulmonary:     Effort: Pulmonary effort is normal. No respiratory distress.     Breath sounds: Normal breath sounds. No wheezing.  Abdominal:     General: There is no distension.     Palpations: Abdomen is soft.     Tenderness: There is no abdominal tenderness.  Musculoskeletal:        General: No swelling or tenderness. Normal range of motion.     Cervical back: Normal range of motion.     Comments: No tenderness to palpation of the left hip or left lower extremity. No tenderness to palpation of the hands bilaterally. No swelling, redness, or decreased range of motion of the hands bilaterally.   Skin:    General: Skin is warm and dry.  Neurological:     General: No focal deficit present.     Mental Status: She is alert.  Psychiatric:        Mood and Affect: Mood normal.        Behavior: Behavior normal.    Vitals:   01/31/21 1100  BP: (!) 136/92  Pulse: 88  Resp: 16  Temp: 98.1 F (36.7 C)  TempSrc: Oral  SpO2: 97%  Weight: 210 lb (95.3  kg)       Assessment & Plan:  I personally reviewed all images and lab data in the [facility] system as well as any outside material available during this office visit and agree with the  radiology impressions.   Essential hypertension Blood pressure reading improved from last visit.  Continue Valsartan-hydrochlorothiazide as prescribed.  Patient had stopped taking atenolol since last visit due to misunderstanding. Restart atenolol 100 mg daily as previously prescribed   Gastroesophageal reflux disease without esophagitis Symptoms well controlled. Continue omeprazole as prescribed.   Lumbago with sciatica, left side Patient reports symptoms are mild and intermittent. Has seen orthopedics and neurosurgery. Will continue to monitor.   Chronic cough Prescribed Flonase for nasal congestion. Discussed to use as prescribed. Most recent chest xray was normal. Will continue to monitor  Chronic hand pain Patient reports symptoms are mild and intermittent. Will continue to monitor. Follow up if worsening.    Diagnoses and all orders for this visit:  Essential hypertension -     atenolol (TENORMIN) 100 MG tablet; TAKE 1 TABLET (100 MG TOTAL) BY MOUTH DAILY. TO LOWER BLOOD PRESSURE  Hyperlipidemia, unspecified hyperlipidemia type -     atorvastatin (LIPITOR) 20 MG tablet; TAKE 1 TABLET (20 MG TOTAL) BY MOUTH DAILY. TO LOWER CHOLESTEROL  Gastroesophageal reflux disease -     omeprazole (PRILOSEC) 40 MG capsule; TAKE 1 CAPSULE (40 MG TOTAL) BY MOUTH 2 (TWO) TIMES DAILY BEFORE A MEAL. TO REDUCE STOMACH ACID  Chronic hand pain, unspecified laterality  Gastroesophageal reflux disease without esophagitis  Chronic left-sided low back pain with left-sided sciatica  Chronic cough  Other orders -     valsartan-hydrochlorothiazide (DIOVAN-HCT) 320-25 MG tablet; TAKE 1 TABLET BY MOUTH DAILY. -     fluticasone (FLONASE) 50 MCG/ACT nasal spray; Place 2 sprays into both nostrils daily. -     Tdap  vaccine greater than or equal to 7yo IM  Note a mammogram will be obtained  Tdap was given  I spent 36 minutes on this patient including time for exam, education, formulation of complex medical decision making

## 2021-01-31 ENCOUNTER — Encounter: Payer: Self-pay | Admitting: Critical Care Medicine

## 2021-01-31 ENCOUNTER — Ambulatory Visit: Payer: Medicaid Other | Attending: Critical Care Medicine | Admitting: Critical Care Medicine

## 2021-01-31 ENCOUNTER — Other Ambulatory Visit: Payer: Self-pay

## 2021-01-31 VITALS — BP 136/92 | HR 88 | Temp 98.1°F | Resp 16 | Wt 210.0 lb

## 2021-01-31 DIAGNOSIS — I10 Essential (primary) hypertension: Secondary | ICD-10-CM | POA: Diagnosis not present

## 2021-01-31 DIAGNOSIS — E785 Hyperlipidemia, unspecified: Secondary | ICD-10-CM

## 2021-01-31 DIAGNOSIS — K219 Gastro-esophageal reflux disease without esophagitis: Secondary | ICD-10-CM | POA: Diagnosis not present

## 2021-01-31 DIAGNOSIS — Z23 Encounter for immunization: Secondary | ICD-10-CM | POA: Diagnosis not present

## 2021-01-31 DIAGNOSIS — M79643 Pain in unspecified hand: Secondary | ICD-10-CM | POA: Diagnosis not present

## 2021-01-31 DIAGNOSIS — G8929 Other chronic pain: Secondary | ICD-10-CM

## 2021-01-31 DIAGNOSIS — R053 Chronic cough: Secondary | ICD-10-CM

## 2021-01-31 DIAGNOSIS — M5442 Lumbago with sciatica, left side: Secondary | ICD-10-CM | POA: Diagnosis not present

## 2021-01-31 MED ORDER — OMEPRAZOLE 40 MG PO CPDR: DELAYED_RELEASE_CAPSULE | ORAL | 1 refills | Status: DC

## 2021-01-31 MED ORDER — VALSARTAN-HYDROCHLOROTHIAZIDE 320-25 MG PO TABS
1.0000 | ORAL_TABLET | Freq: Every day | ORAL | 1 refills | Status: DC
Start: 2021-01-31 — End: 2021-04-03

## 2021-01-31 MED ORDER — FLUTICASONE PROPIONATE 50 MCG/ACT NA SUSP: 2.0000 | Freq: Every day | NASAL | 6 refills | Status: DC

## 2021-01-31 MED ORDER — ATENOLOL 100 MG PO TABS: ORAL_TABLET | ORAL | 1 refills | Status: DC

## 2021-01-31 MED ORDER — ATORVASTATIN CALCIUM 20 MG PO TABS: ORAL_TABLET | ORAL | 1 refills | Status: DC

## 2021-01-31 NOTE — Assessment & Plan Note (Signed)
Symptoms well controlled. Continue omeprazole as prescribed.

## 2021-01-31 NOTE — Assessment & Plan Note (Signed)
Patient reports symptoms are mild and intermittent. Has seen orthopedics and neurosurgery. Will continue to monitor.

## 2021-01-31 NOTE — Assessment & Plan Note (Signed)
Patient reports symptoms are mild and intermittent. Will continue to monitor. Follow up if worsening.

## 2021-01-31 NOTE — Patient Instructions (Signed)
Resume atenolol 1 pill daily Stay on valsartan HCT 1 pill daily Stay on atorvastatin 1 pill daily Stay on omeprazole daily for stomach acid  Begin Flonase 2 sprays each nostril daily for nasal congestion and cough  All medications were refilled and sent to the [facility] on [address]  A mammogram will be scheduled  Tetanus vaccine is ordered  Return to see Dr. Delford Field in 2 months  Reprendre l'atnolol 1 comprim par jour [place] sous valsartan HCT 1 comprim par jour [employer] l'atorvastatine 1 comprim par jour [employer] sur l'omprazole tous les jours pour [employer] gastrique  Begin Flonase 2 pulvrisations quotidiennes dans chaque narine pour la congestion nasale et la toux  [place] mammographie sera programme  Le vaccin contre le ttanos est command  Revenir voir le Dr Janan Halter 2 mois

## 2021-01-31 NOTE — Assessment & Plan Note (Signed)
Blood pressure reading improved from last visit.  Continue Valsartan-hydrochlorothiazide as prescribed.  Patient had stopped taking atenolol since last visit due to misunderstanding. Restart atenolol 100 mg daily as previously prescribed

## 2021-01-31 NOTE — Progress Notes (Signed)
Tightness in hands Generalized body aches x 1 month

## 2021-01-31 NOTE — Assessment & Plan Note (Signed)
Prescribed Flonase for nasal congestion. Discussed to use as prescribed. Most recent chest xray was normal. Will continue to monitor

## 2021-04-02 NOTE — Progress Notes (Signed)
Subjective:    Patient ID: Savannah Reese, female    DOB: 08-25-1956, 65 y.o.   MRN: [id number]   64 y.o.F here for f/u pcp.  Last seen 04/2019  HTN, GERD , pre diabetes This patient is wishing to reestablish primary care in this office was last seen in August 2020 with hypertension reflux disease and prediabetes.  The patient states she still having cough at night and reflux symptoms.  She does travel back and forth to her home country of [place].  She has received her vaccine for Covid.  The patient states she has been compliant with her blood pressure medicines and note recent numbers today are elevated at 140/93.  She is on the lisinopril and does note scratchiness in the throat area.  Patient also has hyperlipidemia and prediabetes but note hemoglobin A1c on arrival today was 5.7.  Note she plans to go back to [place] July 27 and stay for additional 3 to 4 months.  She is due a mammogram and wishes to wait until she returns from that visit.  She denies any other complaints at this time. Note the patient is due a metabolic profile blood count and lipid panel.  11/29/2020 This patient is seen in return follow-up for primary care.  After I last saw her in July of last year she traveled to [place] to visit her family in [place] and was there until end of January of this year.  During that period of time she went up in the hospital with pneumonia and cough.  She states her cough is better pneumonia has resolved but she is yet to receive an x-ray since discharge.  On arrival blood pressure is 152/99.  It is apparent the patient has stopped all of her blood pressure medicine since traveling to [place] and did not have any replacements given while in [place]. She is supposed to be on Tenormin and losartan HCT but is only been starting back to losartan HCT since returning to the states a month ago.  Patient does have some chronic left hip pain otherwise no other complaints.  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  5/31 Patient presents to primary care for follow up of hypertension, left hip pain, GERD, hyperlipidemia, and chronic cough. The patient states she has been taking her atorvastatin 20 mg daily, Diovan-HCT 320-25 mg daily, and Prilosec 40 mg twice daily as prescribed. She states she has not been taking the atenolol 100 mg daily since the last visit because she thought this prescription had been discontinued.  The patient reports she was seen by orthopedics and neurosurgery for her left leg and left hip pain. She states she had an Xray of the left hip which was ordered by the orthopedic doctor which was normal. The neurosurgeonr told her he did not feel she needed any more imaging or follow up with him due to the mild and intermittent nature of her symptoms. The patient states the pain in her left hip and leg only occurs occasionally and is "not too bad." She is able to walk well and is not experiencing any pain in these areas today.   The patient also reports bilateral hand pain for the past 4-5 days. The pain is intermittent and mild in severity. It occurs in all joints in her fingers. She is sometimes able to reproduce the pain when she moves her fingers.  The patient also has a chronic cough which she has been experiencing since having pneumonia in  the past. Recently the patient has been experiencing nasal congestion which she feels has been associated with her cough.   Patient declines COVID booster today. Patient feels her last Tdap vaccine was over 10 years ago. Pt agrees to Tdap today The patient agrees to have her next shingles vaccine at her next visit.   At the patient's previous visit a screening mammogram was ordered for the patient but was not scheduled. The patient agrees to have a screening mammogram once this is scheduled.  04/03/21  Dhjarma interpreter in person: Savannah Reese   Patient returns today for a routine follow up on her blood pressure and low back pain.  She is accompanied by her daughter in law Savannah Reese who provides translation in [place], their native [place] African language.   She continues to take her atenolol and valsartan/hctz as prescribed. She does not have a BP cuff at home but says the medications are working well for her and she takes them every day.   She continues to take omeprazole every day for reflux and says she is doing well. Denies abdominal pain, nor NVD.   She continues to experience low back pain that radiates to her feet bilaterally. She has been taking three 200 mg Ibuprofen to get the desired effect. Ambulates with a cane and denies falls or acute injury. She has seen ortho in the past and has sciatica. Denies muscle spasm.   Several attempts have been made to refer patient for a mammogram but likely have fallen through the cracks due to a language barrier. We obtained her daughter Savannah Reese) contact info and put her as the primary contact. She is currently not in the [place] but will return after Aug 12. She is also due for a colon cancer screening.      Past Medical History:  Diagnosis Date   Arthritis    Hypertension      Family History  Problem Relation Age of Onset   Hypertension Mother    Hypertension Father      Social History   Socioeconomic History   Marital status: Single    Spouse name: Not on file   Number of children: Not on file   Years of education: Not on file   Highest education level: Not on file  Occupational History   Not on file  Tobacco Use   Smoking status: Never   Smokeless tobacco: Never  Vaping Use   Vaping Use: Never used  Substance and Sexual Activity   Alcohol use: No   Drug use: No   Sexual activity: Not Currently  Other Topics Concern   Not on file  Social History Narrative   Not on file   Social Determinants of Health   Financial Resource Strain: Not on file  Food Insecurity: Not on file  Transportation Needs: Not on file  Physical Activity: Not on file   Stress: Not on file  Social Connections: Not on file  Intimate Partner Violence: Not on file     No Known Allergies   Outpatient Medications Prior to Visit  Medication Sig Dispense Refill   atenolol (TENORMIN) 100 MG tablet TAKE 1 TABLET (100 MG TOTAL) BY MOUTH DAILY. TO LOWER BLOOD PRESSURE 90 tablet 1   atorvastatin (LIPITOR) 20 MG tablet TAKE 1 TABLET (20 MG TOTAL) BY MOUTH DAILY. TO LOWER CHOLESTEROL 90 tablet 1   fluticasone (FLONASE) 50 MCG/ACT nasal spray Place 2 sprays into both nostrils daily. 16 g 6   omeprazole (PRILOSEC) 40 MG  capsule TAKE 1 CAPSULE (40 MG TOTAL) BY MOUTH 2 (TWO) TIMES DAILY BEFORE A MEAL. TO REDUCE STOMACH ACID 180 capsule 1   valsartan-hydrochlorothiazide (DIOVAN-HCT) 320-25 MG tablet TAKE 1 TABLET BY MOUTH DAILY. 90 tablet 1   No facility-administered medications prior to visit.      Review of Systems  Constitutional:  Negative for chills, fatigue and fever.  HENT:  Positive for congestion. Negative for nosebleeds, rhinorrhea, sinus pressure, sneezing and sore throat.   Eyes: Negative.   Respiratory:  Positive for cough (dry). Negative for shortness of breath.   Cardiovascular:  Negative for chest pain, palpitations and leg swelling.  Gastrointestinal: Negative.   Genitourinary: Negative.   Musculoskeletal:  Positive for back pain. Negative for joint swelling, neck pain and neck stiffness.          Skin: Negative.   Neurological: Negative.   Psychiatric/Behavioral: Negative.        Objective:   Physical Exam Constitutional:      General: She is not in acute distress.    Appearance: She is obese.  HENT:     Head: Normocephalic and atraumatic.     Nose: No congestion or rhinorrhea.     Mouth/Throat:     Mouth: Mucous membranes are moist.     Pharynx: Oropharynx is clear.  Cardiovascular:     Rate and Rhythm: Normal rate and regular rhythm.     Heart sounds: Normal heart sounds. No murmur heard.   No gallop.  Pulmonary:     Effort:  Pulmonary effort is normal. No respiratory distress.     Breath sounds: Normal breath sounds. No wheezing.  Abdominal:     General: There is no distension.     Palpations: Abdomen is soft.     Tenderness: There is no abdominal tenderness.  Musculoskeletal:        General: No swelling or tenderness. Normal range of motion.     Cervical back: Normal range of motion.     Comments: No tenderness to palpation of the left hip or left lower extremity. No tenderness to palpation of the hands bilaterally. No swelling, redness, or decreased range of motion of the hands bilaterally.   Skin:    General: Skin is warm and dry.  Neurological:     General: No focal deficit present.     Mental Status: She is alert.  Psychiatric:        Mood and Affect: Mood normal.        Behavior: Behavior normal.   Vitals:   04/03/21 1600  BP: 131/82  Pulse: 81  SpO2: 96%  Weight: 96.2 kg       Assessment & Plan:  I personally reviewed all images and lab data in the [facility] system as well as any outside material available during this office visit and agree with the  radiology impressions.   Essential hypertension Much improved on atenolol 100 mg and valsartan-hctz 320-25 mg. No changes today and medications were refilled.  131/82  Gastroesophageal reflux disease without esophagitis Stable with benign exam. Plan to refill and continue omeprazole.   Chronic cough Benign exam and was previously using intranasal flonase for allergy relief. Plan to start benzonatate for prn cough. Recheck at next visit   Lumbago with sciatica, left side Continued lumbar pain with radiation to bilateral lower extremitidy. Ambulating with cane but denies falls or acute injury. Was previously taking three 210m ibuprofen with relief. Plan to switch to meloxicam 331mfor stomach protection instead of  filling 836m ibuprofen. No muscle spasms appreciated during exam.   Mixed hyperlipidemia Continue statin. Previous lipid panel was  in 11/2020 and LDL was 139  Need for hepatitis C screening test Pt is due for routine Hep C screening. Unfortunately lab was closed upon completion of visit. She will return on a later date for a lab visit.   Colon cancer screening Due to language barrier, patient was given fecal occult home test kit for colon cancer screening. It was determined that she would not be a good candidate for cologuard due to complexity and language barrier.    [place] was seen today for follow-up.  Diagnoses and all orders for this visit:  Prediabetes -     HgB A1c -     Glucose (CBG)  Essential hypertension -     atenolol (TENORMIN) 100 MG tablet; TAKE 1 TABLET (100 MG TOTAL) BY MOUTH DAILY. TO LOWER BLOOD PRESSURE  Hyperlipidemia, unspecified hyperlipidemia type -     atorvastatin (LIPITOR) 20 MG tablet; TAKE 1 TABLET (20 MG TOTAL) BY MOUTH DAILY. TO LOWER CHOLESTEROL  Gastroesophageal reflux disease -     omeprazole (PRILOSEC) 40 MG capsule; TAKE 1 CAPSULE (40 MG TOTAL) BY MOUTH 2 (TWO) TIMES DAILY BEFORE A MEAL. TO REDUCE STOMACH ACID  Need for hepatitis C screening test -     HCV Ab w Reflex to Quant PCR  Colon cancer screening -     Fecal occult blood, imunochemical  Encounter for screening mammogram for malignant neoplasm of breast -     MM DIGITAL SCREENING BILATERAL; Future  Need for pneumococcal vaccination -     Pneumococcal conjugate vaccine 20-valent (Prevnar 20)  Gastroesophageal reflux disease without esophagitis  Chronic cough  Chronic bilateral low back pain with left-sided sciatica  Mixed hyperlipidemia  Other orders -     valsartan-hydrochlorothiazide (DIOVAN-HCT) 320-25 MG tablet; TAKE 1 TABLET BY MOUTH DAILY. -     fluticasone (FLONASE) 50 MCG/ACT nasal spray; Place 2 sprays into both nostrils daily. -     meloxicam (MOBIC) 15 MG tablet; Take 1 tablet (15 mg total) by mouth daily. -     benzonatate (TESSALON) 100 MG capsule; Take 1 capsule (100 mg total) by mouth 3  (three) times daily as needed for cough.

## 2021-04-03 ENCOUNTER — Other Ambulatory Visit: Payer: Self-pay

## 2021-04-03 ENCOUNTER — Encounter: Payer: Self-pay | Admitting: Critical Care Medicine

## 2021-04-03 ENCOUNTER — Ambulatory Visit: Payer: Medicaid Other | Attending: Critical Care Medicine | Admitting: Critical Care Medicine

## 2021-04-03 VITALS — BP 131/82 | HR 81 | Wt 212.0 lb

## 2021-04-03 DIAGNOSIS — E782 Mixed hyperlipidemia: Secondary | ICD-10-CM | POA: Diagnosis not present

## 2021-04-03 DIAGNOSIS — Z79899 Other long term (current) drug therapy: Secondary | ICD-10-CM | POA: Diagnosis not present

## 2021-04-03 DIAGNOSIS — Z1231 Encounter for screening mammogram for malignant neoplasm of breast: Secondary | ICD-10-CM

## 2021-04-03 DIAGNOSIS — G8929 Other chronic pain: Secondary | ICD-10-CM

## 2021-04-03 DIAGNOSIS — E785 Hyperlipidemia, unspecified: Secondary | ICD-10-CM

## 2021-04-03 DIAGNOSIS — I10 Essential (primary) hypertension: Secondary | ICD-10-CM

## 2021-04-03 DIAGNOSIS — R7303 Prediabetes: Secondary | ICD-10-CM | POA: Diagnosis not present

## 2021-04-03 DIAGNOSIS — Z23 Encounter for immunization: Secondary | ICD-10-CM

## 2021-04-03 DIAGNOSIS — R053 Chronic cough: Secondary | ICD-10-CM

## 2021-04-03 DIAGNOSIS — M5442 Lumbago with sciatica, left side: Secondary | ICD-10-CM | POA: Diagnosis not present

## 2021-04-03 DIAGNOSIS — K219 Gastro-esophageal reflux disease without esophagitis: Secondary | ICD-10-CM | POA: Diagnosis not present

## 2021-04-03 DIAGNOSIS — M25552 Pain in left hip: Secondary | ICD-10-CM | POA: Diagnosis not present

## 2021-04-03 DIAGNOSIS — Z1159 Encounter for screening for other viral diseases: Secondary | ICD-10-CM

## 2021-04-03 DIAGNOSIS — Z1211 Encounter for screening for malignant neoplasm of colon: Secondary | ICD-10-CM

## 2021-04-03 LAB — POCT GLYCOSYLATED HEMOGLOBIN (HGB A1C): Hemoglobin A1C: 5.8 % — AB (ref 4.0–5.6)

## 2021-04-03 LAB — GLUCOSE, POCT (MANUAL RESULT ENTRY): POC Glucose: 142 mg/dl — AB (ref 70–99)

## 2021-04-03 MED ORDER — ATENOLOL 100 MG PO TABS
ORAL_TABLET | ORAL | 1 refills | Status: DC
  Filled 2021-04-03: qty 30, 30d supply, fill #0
  Filled 2021-06-02 – 2021-06-09 (×2): qty 30, 30d supply, fill #1
  Filled 2021-07-11: qty 30, 30d supply, fill #2
  Filled 2021-08-08: qty 30, 30d supply, fill #3

## 2021-04-03 MED ORDER — FLUTICASONE PROPIONATE 50 MCG/ACT NA SUSP
2.0000 | Freq: Every day | NASAL | 6 refills | Status: DC
  Filled 2021-04-03: qty 16, 30d supply, fill #0
  Filled 2021-06-02 – 2021-06-09 (×2): qty 16, 30d supply, fill #1

## 2021-04-03 MED ORDER — ATORVASTATIN CALCIUM 20 MG PO TABS
ORAL_TABLET | ORAL | 1 refills | Status: DC
Start: 2021-04-03 — End: 2021-08-30
  Filled 2021-04-03: qty 30, 30d supply, fill #0

## 2021-04-03 MED ORDER — OMEPRAZOLE 40 MG PO CPDR
DELAYED_RELEASE_CAPSULE | ORAL | 1 refills | Status: DC
Start: 2021-04-03 — End: 2021-08-30
  Filled 2021-04-03: qty 60, 30d supply, fill #0
  Filled 2021-06-02 – 2021-06-09 (×2): qty 60, 30d supply, fill #1
  Filled 2021-07-11: qty 60, 30d supply, fill #2
  Filled 2021-08-08: qty 60, 30d supply, fill #3

## 2021-04-03 MED ORDER — MELOXICAM 15 MG PO TABS
15.0000 mg | ORAL_TABLET | Freq: Every day | ORAL | 0 refills | Status: DC
  Filled 2021-04-03: qty 30, 30d supply, fill #0

## 2021-04-03 MED ORDER — BENZONATATE 100 MG PO CAPS
100.0000 mg | ORAL_CAPSULE | Freq: Three times a day (TID) | ORAL | 0 refills | Status: DC | PRN
  Filled 2021-04-03: qty 90, 30d supply, fill #0

## 2021-04-03 MED ORDER — VALSARTAN-HYDROCHLOROTHIAZIDE 320-25 MG PO TABS
1.0000 | ORAL_TABLET | Freq: Every day | ORAL | 1 refills | Status: DC
  Filled 2021-04-03: qty 30, 30d supply, fill #0
  Filled 2021-06-02 – 2021-06-09 (×2): qty 30, 30d supply, fill #1
  Filled 2021-07-11: qty 30, 30d supply, fill #2
  Filled 2021-08-08: qty 30, 30d supply, fill #3

## 2021-04-03 NOTE — Assessment & Plan Note (Signed)
Stable with benign exam. Plan to refill and continue omeprazole.

## 2021-04-03 NOTE — Assessment & Plan Note (Signed)
Pt is due for routine Hep C screening. Unfortunately lab was closed upon completion of visit. She will return on a later date for a lab visit.

## 2021-04-03 NOTE — Assessment & Plan Note (Signed)
Benign exam and was previously using intranasal flonase for allergy relief. Plan to start benzonatate for prn cough. Recheck at next visit

## 2021-04-03 NOTE — Patient Instructions (Signed)
Refills on all medications sent to our pharmacy  Lab today is a hepatitis C screen  Pick up at the lab a fecal occult kit for colon cancer screening process and bring it back and drop it off with the lab  A mammogram will be scheduled we will call your daughter after August 12 for the scheduling  Meloxicam 1 daily is given for back pain  Benzonatate 3 times daily as needed for cough and continue Flonase daily  No change in blood pressure medicines  Stay on medication for reflux omeprazole  A pneumococcal vaccine was given  Return to see Dr. Joya Gaskins 4 months  Recharges sur tous les mdicaments envoys  notre pharmacie  Le laboratoire d'aujourd'hui est un dpistage de l'hpatite C  Procurez-vous au laboratoire une trousse occulte fcale pour le processus de dpistage du cancer du clon, Insurance underwriter et dposez-la au laboratoire  Une mammographie sera programme nous appellerons votre fille aprs le 12 aot pour la programmation  Le mloxicam 1 fois par jour est administr pour les maux de dos  Benzonatate 3 fois par jour au besoin pour la toux et continuer Flonase quotidiennement  Aucun changement dans les mdicaments contre l'hypertension  Continuez  prendre des mdicaments contre le reflux omprazole  Un vaccin contre le pneumocoque a t Human resources officer Dr Joya Gaskins 4 mois

## 2021-04-03 NOTE — Assessment & Plan Note (Signed)
Continue statin. Previous lipid panel was in 11/2020 and LDL was 139

## 2021-04-03 NOTE — Assessment & Plan Note (Signed)
Continued lumbar pain with radiation to bilateral lower extremitidy. Ambulating with cane but denies falls or acute injury. Was previously taking three 200mg  ibuprofen with relief. Plan to switch to meloxicam 30mg  for stomach protection instead of filling 800mg  ibuprofen. No muscle spasms appreciated during exam.

## 2021-04-03 NOTE — Assessment & Plan Note (Signed)
Due to language barrier, patient was given fecal occult home test kit for colon cancer screening. It was determined that she would not be a good candidate for cologuard due to complexity and language barrier.

## 2021-04-03 NOTE — Assessment & Plan Note (Signed)
Much improved on atenolol 100 mg and valsartan-hctz 320-25 mg. No changes today and medications were refilled.  131/82

## 2021-04-04 ENCOUNTER — Other Ambulatory Visit: Payer: Self-pay

## 2021-04-04 ENCOUNTER — Ambulatory Visit: Payer: Medicaid Other | Attending: Critical Care Medicine

## 2021-04-04 DIAGNOSIS — Z1159 Encounter for screening for other viral diseases: Secondary | ICD-10-CM | POA: Diagnosis not present

## 2021-04-05 ENCOUNTER — Encounter: Payer: Self-pay | Admitting: *Deleted

## 2021-04-05 LAB — HCV AB W REFLEX TO QUANT PCR: HCV Ab: <0.1 s/co ratio (ref 0.0–0.9)

## 2021-04-05 LAB — HCV INTERPRETATION

## 2021-05-02 DIAGNOSIS — M5116 Intervertebral disc disorders with radiculopathy, lumbar region: Secondary | ICD-10-CM | POA: Diagnosis not present

## 2021-05-04 ENCOUNTER — Other Ambulatory Visit: Payer: Self-pay | Admitting: Orthopedic Surgery

## 2021-05-04 DIAGNOSIS — M5416 Radiculopathy, lumbar region: Secondary | ICD-10-CM

## 2021-06-01 ENCOUNTER — Ambulatory Visit
Admission: RE | Admit: 2021-06-01 | Discharge: 2021-06-01 | Disposition: A | Discharge status: Home / Self Care | Payer: Medicaid Other | Source: Ambulatory Visit | Attending: Orthopedic Surgery | Admitting: Orthopedic Surgery

## 2021-06-01 ENCOUNTER — Other Ambulatory Visit: Payer: Self-pay

## 2021-06-01 DIAGNOSIS — M5416 Radiculopathy, lumbar region: Secondary | ICD-10-CM

## 2021-06-01 DIAGNOSIS — M48061 Spinal stenosis, lumbar region without neurogenic claudication: Secondary | ICD-10-CM | POA: Diagnosis not present

## 2021-06-02 ENCOUNTER — Other Ambulatory Visit: Payer: Self-pay

## 2021-06-02 ENCOUNTER — Ambulatory Visit
Admission: RE | Admit: 2021-06-02 | Discharge: 2021-06-02 | Disposition: A | Discharge status: Home / Self Care | Payer: Medicaid Other | Source: Ambulatory Visit | Attending: Critical Care Medicine | Admitting: Critical Care Medicine

## 2021-06-02 DIAGNOSIS — Z1231 Encounter for screening mammogram for malignant neoplasm of breast: Secondary | ICD-10-CM

## 2021-06-09 ENCOUNTER — Other Ambulatory Visit: Payer: Self-pay

## 2021-07-11 ENCOUNTER — Other Ambulatory Visit: Payer: Self-pay

## 2021-07-12 DIAGNOSIS — M5136 Other intervertebral disc degeneration, lumbar region: Secondary | ICD-10-CM | POA: Diagnosis not present

## 2021-07-12 DIAGNOSIS — M5116 Intervertebral disc disorders with radiculopathy, lumbar region: Secondary | ICD-10-CM | POA: Diagnosis not present

## 2021-07-12 DIAGNOSIS — M5416 Radiculopathy, lumbar region: Secondary | ICD-10-CM | POA: Diagnosis not present

## 2021-07-12 DIAGNOSIS — M47816 Spondylosis without myelopathy or radiculopathy, lumbar region: Secondary | ICD-10-CM | POA: Diagnosis not present

## 2021-07-20 DIAGNOSIS — M48062 Spinal stenosis, lumbar region with neurogenic claudication: Secondary | ICD-10-CM | POA: Diagnosis not present

## 2021-07-20 DIAGNOSIS — M21371 Foot drop, right foot: Secondary | ICD-10-CM | POA: Diagnosis not present

## 2021-07-25 DIAGNOSIS — M5136 Other intervertebral disc degeneration, lumbar region: Secondary | ICD-10-CM | POA: Insufficient documentation

## 2021-07-25 DIAGNOSIS — M51369 Other intervertebral disc degeneration, lumbar region without mention of lumbar back pain or lower extremity pain: Secondary | ICD-10-CM | POA: Insufficient documentation

## 2021-07-25 DIAGNOSIS — M47816 Spondylosis without myelopathy or radiculopathy, lumbar region: Secondary | ICD-10-CM | POA: Insufficient documentation

## 2021-07-26 ENCOUNTER — Other Ambulatory Visit: Payer: Self-pay

## 2021-08-07 ENCOUNTER — Ambulatory Visit: Payer: Medicaid Other | Admitting: Critical Care Medicine

## 2021-08-08 ENCOUNTER — Other Ambulatory Visit: Payer: Self-pay

## 2021-08-11 ENCOUNTER — Other Ambulatory Visit: Payer: Self-pay

## 2021-08-30 ENCOUNTER — Ambulatory Visit: Payer: Medicaid Other | Attending: Critical Care Medicine | Admitting: Critical Care Medicine

## 2021-08-30 ENCOUNTER — Encounter: Payer: Self-pay | Admitting: Critical Care Medicine

## 2021-08-30 ENCOUNTER — Other Ambulatory Visit: Payer: Self-pay

## 2021-08-30 VITALS — BP 107/75 | HR 84 | Ht 66.0 in | Wt 205.2 lb

## 2021-08-30 DIAGNOSIS — E785 Hyperlipidemia, unspecified: Secondary | ICD-10-CM

## 2021-08-30 DIAGNOSIS — G8929 Other chronic pain: Secondary | ICD-10-CM

## 2021-08-30 DIAGNOSIS — I10 Essential (primary) hypertension: Secondary | ICD-10-CM

## 2021-08-30 DIAGNOSIS — E782 Mixed hyperlipidemia: Secondary | ICD-10-CM | POA: Diagnosis not present

## 2021-08-30 DIAGNOSIS — Z1211 Encounter for screening for malignant neoplasm of colon: Secondary | ICD-10-CM

## 2021-08-30 DIAGNOSIS — M5442 Lumbago with sciatica, left side: Secondary | ICD-10-CM

## 2021-08-30 DIAGNOSIS — Z9189 Other specified personal risk factors, not elsewhere classified: Secondary | ICD-10-CM

## 2021-08-30 DIAGNOSIS — K219 Gastro-esophageal reflux disease without esophagitis: Secondary | ICD-10-CM | POA: Diagnosis not present

## 2021-08-30 MED ORDER — ATORVASTATIN CALCIUM 20 MG PO TABS
ORAL_TABLET | ORAL | 1 refills | Status: DC
  Filled 2021-08-30 – 2021-12-05 (×3): qty 90, 90d supply, fill #0

## 2021-08-30 MED ORDER — OMEPRAZOLE 40 MG PO CPDR
DELAYED_RELEASE_CAPSULE | ORAL | 1 refills | Status: DC
  Filled 2021-08-30 – 2021-12-05 (×4): qty 180, 90d supply, fill #0

## 2021-08-30 MED ORDER — VALSARTAN-HYDROCHLOROTHIAZIDE 320-25 MG PO TABS
1.0000 | ORAL_TABLET | Freq: Every day | ORAL | 1 refills | Status: DC
  Filled 2021-08-30 – 2021-12-05 (×4): qty 90, 90d supply, fill #0

## 2021-08-30 MED ORDER — MELOXICAM 15 MG PO TABS
15.0000 mg | ORAL_TABLET | Freq: Every day | ORAL | 0 refills | Status: DC
  Filled 2021-08-30: qty 30, 30d supply, fill #0

## 2021-08-30 MED ORDER — ATENOLOL 100 MG PO TABS
ORAL_TABLET | ORAL | 1 refills | Status: DC
  Filled 2021-08-30: qty 90, fill #0
  Filled 2021-08-31 – 2021-12-05 (×3): qty 90, 90d supply, fill #0

## 2021-08-30 MED ORDER — BLOOD PRESSURE MONITOR 7 DEVI: 0 refills | Status: DC

## 2021-08-30 NOTE — Patient Instructions (Signed)
Referral to physical therapy was made  You declined a flu vaccine  Cologuard kit will be mailed to your home please process and should back to the company for colon cancer screening  Meloxicam was refilled and all your other medications were refilled to our pharmacy  A blood pressure monitoring device was ordered at [place] to be mailed to your home this should be covered under Medicaid  Return to see Dr. Joya Gaskins 4 months  Please resume atorvastatin 1 daily this was sent to our pharmacy for cholesterol  L'orientation vers la physiothrapie a t faite  Vous avez refus un vaccin contre la grippe  Le kit Cologuard sera envoy par la poste  votre domicile, Counsellor et doit tre renvoy  l'entreprise pour [employer] du cancer du clon  [employer] mloxicam a t Location manager et tous vos autres mdicaments ont t Psychologist, educational  notre pharmacie  Un appareil de surveillance de la pression artrielle a t command  la [employer] pour tre [employer] par la poste  votre domicile, cela devrait tre couvert par Medicaid  Retourner voir le Dr Joya Gaskins 4 Lauris Poag reprendre l'atorvastatine 1 quotidiennement ceci a t envoy  notre pharmacie pour [employer]

## 2021-08-30 NOTE — Assessment & Plan Note (Signed)
Blood pressure well controlled continue atenolol and valsartan HCT refills given

## 2021-08-30 NOTE — Assessment & Plan Note (Signed)
Refills on atorvastatin made

## 2021-08-30 NOTE — Progress Notes (Signed)
Established Patient Office Visit  Subjective:  Patient ID: Savannah Reese, female    DOB: 11/15/1955  Age: 65 y.o. MRN: [id number]  CC:  Chief Complaint  Patient presents with   Hypertension    HPI Savannah Reese - [place] presents for primary care follow-up note patient's interpreter was her daughter Savannah Reese who speaks fluent English and spoke to the patient in her native language [place].  Patient is here in return follow-up overall doing well with no real complaints.  On arrival blood pressure is good 107/75.  Patient maintains valsartan HCT and atenolol with good control blood pressure.  Patient is compliant with her other medications but does need refills on meloxicam and atorvastatin.  Patient continues have low back pain is been seen by orthopedic spine they referred her to physical therapy but she is yet to hear from the physical therapy office.  Patient does need colon cancer screening and she agrees to receive the Cologuard kit for this.  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   Procedure: TOTAL KNEE ARTHROPLASTY;  Surgeon: Vickey Huger, MD;  Location: [place];  Service: Orthopedics;  Laterality: Right;   TOTAL KNEE ARTHROPLASTY Left 05/28/2016   Procedure: TOTAL KNEE ARTHROPLASTY;  Surgeon: Vickey Huger, MD;  Location: [place];  Service: Orthopedics;  Laterality: Left;    Family History  Problem Relation Age of Onset   Hypertension Mother    Hypertension Father     Social History   Socioeconomic History   Marital status: Single    Spouse name: Not on file   Number of children: Not on file   Years of education: Not on file   Highest education level: Not on file  Occupational History   Not on file  Tobacco Use   Smoking status: Never   Smokeless tobacco: Never  Vaping Use   Vaping Use:  Never used  Substance and Sexual Activity   Alcohol use: No   Drug use: No   Sexual activity: Not Currently  Other Topics Concern   Not on file  Social History Narrative   ** Merged History Encounter **       Social Determinants of Health   Financial Resource Strain: Not on file  Food Insecurity: Not on file  Transportation Needs: Not on file  Physical Activity: Not on file  Stress: Not on file  Social Connections: Not on file  Intimate Partner Violence: Not on file    Outpatient Medications Prior to Visit  Medication Sig Dispense Refill   atenolol (TENORMIN) 100 MG tablet TAKE 1 TABLET (100 MG TOTAL) BY MOUTH DAILY. TO LOWER BLOOD PRESSURE 90 tablet 1   meloxicam (MOBIC) 15 MG tablet Take 1 tablet (15 mg total) by mouth daily. 30 tablet 0   omeprazole (PRILOSEC) 40 MG capsule TAKE 1 CAPSULE (40 MG TOTAL) BY MOUTH 2 (TWO) TIMES DAILY BEFORE A MEAL. TO REDUCE STOMACH ACID 180 capsule 1   valsartan-hydrochlorothiazide (DIOVAN-HCT) 320-25 MG tablet TAKE 1 TABLET BY MOUTH DAILY. 90 tablet 1   atorvastatin (LIPITOR) 20 MG tablet TAKE 1 TABLET (20 MG TOTAL) BY MOUTH DAILY. TO LOWER CHOLESTEROL (Patient not taking: Reported on 08/30/2021) 90 tablet 1   benzonatate (TESSALON) 100 MG capsule Take 1 capsule (100 mg total) by mouth 3 (three) times daily as  needed for cough. (Patient not taking: Reported on 08/30/2021) 90 capsule 0   fluticasone (FLONASE) 50 MCG/ACT nasal spray Place 2 sprays into both nostrils daily. (Patient not taking: Reported on 08/30/2021) 16 g 6   valsartan (DIOVAN) 320 MG tablet Take by mouth.     No facility-administered medications prior to visit.    No Known Allergies  ROS Review of Systems  Constitutional:  Negative for chills, diaphoresis and fever.  HENT:  Negative for congestion, hearing loss, nosebleeds, sore throat and tinnitus.   Eyes:  Negative for photophobia and redness.  Respiratory:  Negative for cough, shortness of breath, wheezing and stridor.    Cardiovascular:  Negative for chest pain, palpitations and leg swelling.  Gastrointestinal:  Negative for abdominal pain, blood in stool, constipation, diarrhea, nausea and vomiting.  Endocrine: Negative for polydipsia.  Genitourinary:  Negative for dysuria, flank pain, frequency, hematuria and urgency.  Musculoskeletal:  Positive for back pain. Negative for myalgias and neck pain.  Skin:  Negative for rash.  Allergic/Immunologic: Negative for environmental allergies.  Neurological:  Negative for dizziness, tremors, seizures, weakness and headaches.  Hematological:  Does not bruise/bleed easily.  Psychiatric/Behavioral:  Negative for suicidal ideas. The patient is not nervous/anxious.      Objective:    Physical Exam Vitals reviewed.  Constitutional:      Appearance: Normal appearance. She is well-developed. She is not diaphoretic.  HENT:     Head: Normocephalic and atraumatic.     Nose: No nasal deformity, septal deviation, mucosal edema or rhinorrhea.     Right Sinus: No maxillary sinus tenderness or frontal sinus tenderness.     Left Sinus: No maxillary sinus tenderness or frontal sinus tenderness.     Mouth/Throat:     Pharynx: No oropharyngeal exudate.  Eyes:     General: No scleral icterus.    Conjunctiva/sclera: Conjunctivae normal.     Pupils: Pupils are equal, round, and reactive to light.  Neck:     Thyroid: No thyromegaly.     Vascular: No carotid bruit or JVD.     Trachea: Trachea normal. No tracheal tenderness or tracheal deviation.  Cardiovascular:     Rate and Rhythm: Normal rate and regular rhythm.     Chest Wall: PMI is not displaced.     Pulses: Normal pulses. No decreased pulses.     Heart sounds: Normal heart sounds, S1 normal and S2 normal. Heart sounds not distant. No murmur heard. No systolic murmur is present.  No diastolic murmur is present.    No friction rub. No gallop. No S3 or S4 sounds.  Pulmonary:     Effort: No tachypnea, accessory muscle  usage or respiratory distress.     Breath sounds: No stridor. No decreased breath sounds, wheezing, rhonchi or rales.  Chest:     Chest wall: No tenderness.  Abdominal:     General: Bowel sounds are normal. There is no distension.     Palpations: Abdomen is soft. Abdomen is not rigid.     Tenderness: There is no abdominal tenderness. There is no guarding or rebound.  Musculoskeletal:        General: Normal range of motion.     Cervical back: Normal range of motion and neck supple. No edema, erythema or rigidity. No muscular tenderness. Normal range of motion.  Lymphadenopathy:     Head:     Right side of head: No submental or submandibular adenopathy.     Left side of head: No submental or  submandibular adenopathy.     Cervical: No cervical adenopathy.  Skin:    General: Skin is warm and dry.     Coloration: Skin is not pale.     Findings: No rash.     Nails: There is no clubbing.  Neurological:     Mental Status: She is alert and oriented to person, place, and time.     Sensory: No sensory deficit.  Psychiatric:        Speech: Speech normal.        Behavior: Behavior normal.    BP 107/75    Pulse 84    Ht 5' 6"  (1.676 m)    Wt 205 lb 3.2 oz (93.1 kg)    SpO2 100%    BMI 33.12 kg/m  Wt Readings from Last 3 Encounters:  08/30/21 205 lb 3.2 oz (93.1 kg)  04/03/21 212 lb (96.2 kg)  01/31/21 210 lb (95.3 kg)     Health Maintenance Due  Topic Date Due   Zoster Vaccines- Shingrix (1 of 2) Never done   Fecal DNA (Cologuard)  Never done   DEXA SCAN  Never done    There are no preventive care reminders to display for this patient.  Lab Results  Component Value Date   TSH 1.110 11/29/2020   Lab Results  Component Value Date   WBC 5.4 11/29/2020   HGB 14.0 11/29/2020   HCT 43.2 11/29/2020   MCV 88 11/29/2020   PLT 252 11/29/2020   Lab Results  Component Value Date   NA 142 11/29/2020   K 4.4 11/29/2020   CO2 23 11/29/2020   GLUCOSE 85 11/29/2020   BUN 12  11/29/2020   CREATININE 0.81 11/29/2020   BILITOT 0.3 11/29/2020   ALKPHOS 57 11/29/2020   AST 16 11/29/2020   ALT 10 11/29/2020   PROT 7.7 11/29/2020   ALBUMIN 4.2 11/29/2020   CALCIUM 9.5 11/29/2020   ANIONGAP 9 09/07/2018   EGFR 80 11/29/2020   Lab Results  Component Value Date   CHOL 207 (H) 11/29/2020   Lab Results  Component Value Date   HDL 56 11/29/2020   Lab Results  Component Value Date   LDLCALC 139 (H) 11/29/2020   Lab Results  Component Value Date   TRIG 66 11/29/2020   Lab Results  Component Value Date   CHOLHDL 3.7 11/29/2020   Lab Results  Component Value Date   HGBA1C 5.8 (A) 04/03/2021      Assessment & Plan:   Problem List Items Addressed This Visit       Cardiovascular and Mediastinum   Essential hypertension    Blood pressure well controlled continue atenolol and valsartan HCT refills given      Relevant Medications   valsartan-hydrochlorothiazide (DIOVAN-HCT) 320-25 MG tablet   atorvastatin (LIPITOR) 20 MG tablet   atenolol (TENORMIN) 100 MG tablet     Nervous and Auditory   Lumbago with sciatica, left side    Chronic low back pain seeing orthopedic spine plan to refill meloxicam and I will make a referral to a local physical therapist      Relevant Medications   meloxicam (MOBIC) 15 MG tablet   Other Relevant Orders   Ambulatory referral to Physical Therapy     Other   Mixed hyperlipidemia    Refills on atorvastatin made      Relevant Medications   valsartan-hydrochlorothiazide (DIOVAN-HCT) 320-25 MG tablet   atorvastatin (LIPITOR) 20 MG tablet   atenolol (TENORMIN) 100 MG tablet  Colon cancer screening - Primary    Patient never process her colon cancer screening will reorder the Cologuard kit      Relevant Orders   DG Bone Density   Cologuard   Other Visit Diagnoses     At high risk for fracture       Relevant Orders   DG Bone Density   Gastroesophageal reflux disease       Relevant Medications    omeprazole (PRILOSEC) 40 MG capsule   Hyperlipidemia, unspecified hyperlipidemia type       Relevant Medications   valsartan-hydrochlorothiazide (DIOVAN-HCT) 320-25 MG tablet   atorvastatin (LIPITOR) 20 MG tablet   atenolol (TENORMIN) 100 MG tablet       Meds ordered this encounter  Medications   Blood Pressure Monitoring (BLOOD PRESSURE MONITOR 7) DEVI    Sig: Use to monitor blood pressure    Dispense:  1 each    Refill:  0    Mail to patient home, has medicaid   meloxicam (MOBIC) 15 MG tablet    Sig: Take 1 tablet (15 mg total) by mouth daily.    Dispense:  30 tablet    Refill:  0   omeprazole (PRILOSEC) 40 MG capsule    Sig: TAKE 1 CAPSULE (40 MG TOTAL) BY MOUTH 2 (TWO) TIMES DAILY BEFORE A MEAL. TO REDUCE STOMACH ACID    Dispense:  180 capsule    Refill:  1   valsartan-hydrochlorothiazide (DIOVAN-HCT) 320-25 MG tablet    Sig: TAKE 1 TABLET BY MOUTH DAILY.    Dispense:  90 tablet    Refill:  1   atorvastatin (LIPITOR) 20 MG tablet    Sig: TAKE 1 TABLET (20 MG TOTAL) BY MOUTH DAILY. TO LOWER CHOLESTEROL    Dispense:  90 tablet    Refill:  1   atenolol (TENORMIN) 100 MG tablet    Sig: TAKE 1 TABLET (100 MG TOTAL) BY MOUTH DAILY. TO LOWER BLOOD PRESSURE    Dispense:  90 tablet    Refill:  1    Follow-up: Return in about 4 months (around 12/29/2021).    Asencion Noble, MD

## 2021-08-30 NOTE — Assessment & Plan Note (Signed)
Chronic low back pain seeing orthopedic spine plan to refill meloxicam and I will make a referral to a local physical therapist

## 2021-08-30 NOTE — Assessment & Plan Note (Signed)
Patient never process her colon cancer screening will reorder the Cologuard kit

## 2021-08-31 ENCOUNTER — Other Ambulatory Visit: Payer: Self-pay

## 2021-09-01 ENCOUNTER — Other Ambulatory Visit: Payer: Self-pay

## 2021-09-04 ENCOUNTER — Other Ambulatory Visit: Payer: Self-pay

## 2021-09-06 DIAGNOSIS — Z1211 Encounter for screening for malignant neoplasm of colon: Secondary | ICD-10-CM | POA: Diagnosis not present

## 2021-09-13 LAB — COLOGUARD: COLOGUARD: NEGATIVE

## 2021-09-14 ENCOUNTER — Telehealth: Payer: Self-pay

## 2021-09-14 NOTE — Telephone Encounter (Signed)
Called pt and someone else answered when asked for her they hung up I will send information to nurse pool  Interpreter ID# [id number]

## 2021-09-14 NOTE — Telephone Encounter (Signed)
-----   Message from Storm Frisk, MD sent at 09/13/2021  3:14 PM EST ----- Let pt know cologuard negative

## 2021-11-08 ENCOUNTER — Other Ambulatory Visit: Payer: Self-pay

## 2021-12-05 ENCOUNTER — Other Ambulatory Visit: Payer: Self-pay

## 2021-12-06 ENCOUNTER — Other Ambulatory Visit: Payer: Self-pay

## 2021-12-07 ENCOUNTER — Other Ambulatory Visit: Payer: Self-pay

## 2021-12-31 NOTE — Progress Notes (Signed)
? ?Established Patient Office Visit ? ?Subjective:  ?Patient ID: Savannah Reese, female    DOB: [birth year]  Age: 66 y.o. MRN: [id number] ? ?CC:  ?Chief Complaint  ?Patient presents with  ? Medication Refill  ? Leg cramps  ? ? ?HPI ?08/30/21 ?Savannah Reese presents for primary care follow-up note patient's interpreter was her daughter Larence Penning who speaks fluent English and spoke to the patient in her native language [place].  Patient is here in return follow-up overall doing well with no real complaints.  On arrival blood pressure is good 107/75.  Patient maintains valsartan HCT and atenolol with good control blood pressure.  Patient is compliant with her other medications but does need refills on meloxicam and atorvastatin.  Patient continues have low back pain is been seen by orthopedic spine they referred her to physical therapy but she is yet to hear from the physical therapy office. ? ?Patient does need colon cancer screening and she agrees to receive the Cologuard kit for this.  She does have Medicaid ? ? ?01/01/22 ?This a 66 year old female who is originally from [place] ?Patient seen by way of video [place] interpreter Hart Carwin [phone] ?At the last visit in December we try to get her physical therapy they tried calling there was no answer and no way to leave a voicemail.  Patient complains that no one called her for her physical therapy but I suspect there is been a breakdown of communication.  She does speak [place] but I do not think understands [place] and certainly does not understand English extremely well. ? ?Her son who was to be with her today had to leave early and I saw her with the interpreter alone.  She had an MRI performed a year ago which showed severe lumbar disease they recommended injection she refused the injections so now physical therapy needs to occur.  Patient also wants to travel back to her country this June.  On arrival blood pressure is good 132/84.  She has been taking her blood  pressure medications.  She does need refills.  Patient does continue complain of left greater than right lower extremity pain  ?Past Medical History:  ?Diagnosis Date  ? Arthritis   ? Closed fracture of tibial plateau 12/04/2011  ? Hypertension   ? ? ?Past Surgical History:  ?Procedure Laterality Date  ? 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 Procedure: TOTAL KNEE ARTHROPLASTY;  Surgeon: Vickey Huger, MD;  Location: [place];  Service: Orthopedics;  Laterality: Right;  ? TOTAL KNEE ARTHROPLASTY Left 05/28/2016  ? Procedure: TOTAL KNEE ARTHROPLASTY;  Surgeon: Vickey Huger, MD;  Location: [place];  Service: Orthopedics;  Laterality: Left;  ? ? ?Family History  ?Problem Relation Age of Onset  ? Hypertension Mother   ? Hypertension Father   ? ? ?Social History  ? ?Socioeconomic History  ? Marital status: Single  ?  Spouse name: Not on file  ? Number of children: Not on file  ? Years of education: Not on file  ? Highest education level: Not on file  ?Occupational History  ? Not on file  ?Tobacco Use  ? Smoking status: Never  ? Smokeless tobacco: Never  ?Vaping Use  ? Vaping Use: Never used  ?Substance and Sexual Activity  ? Alcohol use: No  ? Drug use: No  ? Sexual activity: Not Currently  ?Other Topics Concern  ? Not on file  ?Social History Narrative  ? ** Merged History Encounter **  ?    ? ?  Social Determinants of Health  ? ?Financial Resource Strain: Not on file  ?Food Insecurity: Not on file  ?Transportation Needs: Not on file  ?Physical Activity: Not on file  ?Stress: Not on file  ?Social Connections: Not on file  ?Intimate Partner Violence: Not on file  ? ? ?Outpatient Medications Prior to Visit  ?Medication Sig Dispense Refill  ? Blood Pressure Monitoring (BLOOD PRESSURE MONITOR 7) DEVI Use to monitor blood pressure 1 each 0  ? atenolol (TENORMIN) 100 MG tablet TAKE 1 TABLET (100 MG TOTAL) BY MOUTH DAILY. TO LOWER BLOOD PRESSURE 90 tablet 1  ? atorvastatin (LIPITOR) 20 MG tablet TAKE 1 TABLET (20 MG TOTAL) BY MOUTH  DAILY. TO LOWER CHOLESTEROL 90 tablet 1  ? meloxicam (MOBIC) 15 MG tablet Take 1 tablet (15 mg total) by mouth daily. 30 tablet 0  ? omeprazole (PRILOSEC) 40 MG capsule TAKE 1 CAPSULE (40 MG TOTAL) BY MOUTH 2 (TWO) TIMES DAILY BEFORE A MEAL. TO REDUCE STOMACH ACID 180 capsule 1  ? valsartan-hydrochlorothiazide (DIOVAN-HCT) 320-25 MG tablet TAKE 1 TABLET BY MOUTH DAILY. 90 tablet 1  ? ?No facility-administered medications prior to visit.  ? ? ?No Known Allergies ? ?ROS ?Review of Systems  ?Constitutional:  Negative for chills, diaphoresis and fever.  ?HENT:  Negative for congestion, hearing loss, nosebleeds, sore throat and tinnitus.   ?Eyes:  Negative for photophobia and redness.  ?Respiratory:  Negative for cough, shortness of breath, wheezing and stridor.   ?Cardiovascular:  Negative for chest pain, palpitations and leg swelling.  ?Gastrointestinal:  Negative for abdominal pain, blood in stool, constipation, diarrhea, nausea and vomiting.  ?Endocrine: Negative for polydipsia.  ?Genitourinary:  Negative for dysuria, flank pain, frequency, hematuria and urgency.  ?Musculoskeletal:  Positive for back pain. Negative for myalgias and neck pain.  ?Skin:  Negative for rash.  ?Allergic/Immunologic: Negative for environmental allergies.  ?Neurological:  Negative for dizziness, tremors, seizures, weakness and headaches.  ?Hematological:  Does not bruise/bleed easily.  ?Psychiatric/Behavioral:  Negative for suicidal ideas. The patient is not nervous/anxious.   ? ?  ?Objective:  ?  ?Physical Exam ?Vitals reviewed.  ?Constitutional:   ?   Appearance: Normal appearance. She is well-developed. She is not diaphoretic.  ?HENT:  ?   Head: Normocephalic and atraumatic.  ?   Nose: No nasal deformity, septal deviation, mucosal edema or rhinorrhea.  ?   Right Sinus: No maxillary sinus tenderness or frontal sinus tenderness.  ?   Left Sinus: No maxillary sinus tenderness or frontal sinus tenderness.  ?   Mouth/Throat:  ?   Pharynx: No  oropharyngeal exudate.  ?Eyes:  ?   General: No scleral icterus. ?   Conjunctiva/sclera: Conjunctivae normal.  ?   Pupils: Pupils are equal, round, and reactive to light.  ?Neck:  ?   Thyroid: No thyromegaly.  ?   Vascular: No carotid bruit or JVD.  ?   Trachea: Trachea normal. No tracheal tenderness or tracheal deviation.  ?Cardiovascular:  ?   Rate and Rhythm: Normal rate and regular rhythm.  ?   Chest Wall: PMI is not displaced.  ?   Pulses: Normal pulses. No decreased pulses.  ?   Heart sounds: Normal heart sounds, S1 normal and S2 normal. Heart sounds not distant. No murmur heard. ?No systolic murmur is present.  ?No diastolic murmur is present.  ?  No friction rub. No gallop. No S3 or S4 sounds.  ?Pulmonary:  ?   Effort: No tachypnea, accessory muscle usage or respiratory  distress.  ?   Breath sounds: No stridor. No decreased breath sounds, wheezing, rhonchi or rales.  ?Chest:  ?   Chest wall: No tenderness.  ?Abdominal:  ?   General: Bowel sounds are normal. There is no distension.  ?   Palpations: Abdomen is soft. Abdomen is not rigid.  ?   Tenderness: There is no abdominal tenderness. There is no guarding or rebound.  ?Musculoskeletal:     ?   General: Normal range of motion.  ?   Cervical back: Normal range of motion and neck supple. No edema, erythema or rigidity. No muscular tenderness. Normal range of motion.  ?Lymphadenopathy:  ?   Head:  ?   Right side of head: No submental or submandibular adenopathy.  ?   Left side of head: No submental or submandibular adenopathy.  ?   Cervical: No cervical adenopathy.  ?Skin: ?   General: Skin is warm and dry.  ?   Coloration: Skin is not pale.  ?   Findings: No rash.  ?   Nails: There is no clubbing.  ?Neurological:  ?   Mental Status: She is alert and oriented to person, place, and time.  ?   Sensory: No sensory deficit.  ?Psychiatric:     ?   Speech: Speech normal.     ?   Behavior: Behavior normal.  ? ? ?BP 132/84   Pulse 76   Wt 204 lb 12.8 oz (92.9 kg)    SpO2 98%   BMI 33.06 kg/m?  ?Wt Readings from Last 3 Encounters:  ?01/01/22 204 lb 12.8 oz (92.9 kg)  ?08/30/21 205 lb 3.2 oz (93.1 kg)  ?04/03/21 212 lb (96.2 kg)  ? ? ? ?Health Maintenance Due  ?Topic Date

## 2022-01-01 ENCOUNTER — Ambulatory Visit: Payer: Medicaid Other | Attending: Critical Care Medicine | Admitting: Critical Care Medicine

## 2022-01-01 ENCOUNTER — Encounter: Payer: Self-pay | Admitting: Critical Care Medicine

## 2022-01-01 ENCOUNTER — Other Ambulatory Visit: Payer: Self-pay

## 2022-01-01 VITALS — BP 132/84 | HR 76 | Wt 204.8 lb

## 2022-01-01 DIAGNOSIS — E782 Mixed hyperlipidemia: Secondary | ICD-10-CM

## 2022-01-01 DIAGNOSIS — G8929 Other chronic pain: Secondary | ICD-10-CM

## 2022-01-01 DIAGNOSIS — K219 Gastro-esophageal reflux disease without esophagitis: Secondary | ICD-10-CM | POA: Diagnosis not present

## 2022-01-01 DIAGNOSIS — M5442 Lumbago with sciatica, left side: Secondary | ICD-10-CM | POA: Diagnosis not present

## 2022-01-01 DIAGNOSIS — M5136 Other intervertebral disc degeneration, lumbar region: Secondary | ICD-10-CM

## 2022-01-01 DIAGNOSIS — I10 Essential (primary) hypertension: Secondary | ICD-10-CM | POA: Diagnosis not present

## 2022-01-01 DIAGNOSIS — M47816 Spondylosis without myelopathy or radiculopathy, lumbar region: Secondary | ICD-10-CM

## 2022-01-01 DIAGNOSIS — E785 Hyperlipidemia, unspecified: Secondary | ICD-10-CM | POA: Diagnosis not present

## 2022-01-01 DIAGNOSIS — M51369 Other intervertebral disc degeneration, lumbar region without mention of lumbar back pain or lower extremity pain: Secondary | ICD-10-CM

## 2022-01-01 MED ORDER — MELOXICAM 15 MG PO TABS
15.0000 mg | ORAL_TABLET | Freq: Every day | ORAL | 0 refills | Status: DC
  Filled 2022-01-01: qty 30, 30d supply, fill #0

## 2022-01-01 MED ORDER — OMEPRAZOLE 40 MG PO CPDR
DELAYED_RELEASE_CAPSULE | ORAL | 1 refills | Status: DC
  Filled 2022-01-01: qty 180, fill #0
  Filled 2022-02-13 – 2022-02-15 (×2): qty 180, 90d supply, fill #0
  Filled 2022-07-30: qty 180, 90d supply, fill #1

## 2022-01-01 MED ORDER — VALSARTAN-HYDROCHLOROTHIAZIDE 320-25 MG PO TABS
1.0000 | ORAL_TABLET | Freq: Every day | ORAL | 2 refills | Status: DC
  Filled 2022-01-01 – 2022-02-15 (×3): qty 90, 90d supply, fill #0
  Filled 2022-07-30: qty 90, 90d supply, fill #1

## 2022-01-01 MED ORDER — ATENOLOL 100 MG PO TABS
ORAL_TABLET | ORAL | 1 refills | Status: DC
  Filled 2022-01-01: qty 90, fill #0
  Filled 2022-02-13 – 2022-02-15 (×2): qty 90, 90d supply, fill #0
  Filled 2022-07-30: qty 90, 90d supply, fill #1

## 2022-01-01 MED ORDER — ATORVASTATIN CALCIUM 20 MG PO TABS
ORAL_TABLET | ORAL | 1 refills | Status: DC
  Filled 2022-01-01: qty 90, fill #0
  Filled 2022-02-13 – 2022-02-15 (×2): qty 90, 90d supply, fill #0
  Filled 2022-07-30: qty 90, 90d supply, fill #1

## 2022-01-01 NOTE — Patient Instructions (Addendum)
No change in medications refill sent to our pharmacy ? ?Physical therapy was sent again as a referral please be on the look out for phone call we gave you the number they will be calling from ? ?Return to Dr. Delford Field 4 months ? ? ? ?Pas de changement dans les recharges de m?dicaments envoy?es ? notre pharmacie ? ?La physioth?rapie a ?t? envoy?e ? nouveau en tant que r?f?rence, veuillez ?tre ? l'aff?t des appels t?l?phoniques, nous vous avons donn? le num?ro d'o? ils appelleront ? ?Retour au Dr Delford Field 4 mois ?

## 2022-01-01 NOTE — Assessment & Plan Note (Signed)
Continue with current statin therapy 

## 2022-01-01 NOTE — Assessment & Plan Note (Signed)
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  Note they did get an interpreter on the phone to try to get her connected.  I gave the patient the number of the physical therapy office and told her to be on the look out for this phone call. ?

## 2022-01-01 NOTE — Assessment & Plan Note (Signed)
Blood pressure well controlled no change in medication made ?

## 2022-01-25 ENCOUNTER — Encounter: Payer: Self-pay | Admitting: Physical Therapy

## 2022-01-25 ENCOUNTER — Ambulatory Visit: Payer: Medicaid Other | Attending: Critical Care Medicine | Admitting: Physical Therapy

## 2022-01-25 DIAGNOSIS — M6281 Muscle weakness (generalized): Secondary | ICD-10-CM | POA: Diagnosis present

## 2022-01-25 DIAGNOSIS — G8929 Other chronic pain: Secondary | ICD-10-CM | POA: Insufficient documentation

## 2022-01-25 DIAGNOSIS — R2681 Unsteadiness on feet: Secondary | ICD-10-CM | POA: Diagnosis present

## 2022-01-25 DIAGNOSIS — M545 Low back pain, unspecified: Secondary | ICD-10-CM | POA: Diagnosis present

## 2022-01-25 DIAGNOSIS — R2689 Other abnormalities of gait and mobility: Secondary | ICD-10-CM | POA: Insufficient documentation

## 2022-01-25 NOTE — Therapy (Signed)
OUTPATIENT PHYSICAL THERAPY THORACOLUMBAR EVALUATION   Patient Name: Savannah Reese MRN: [id number] DOB:[birth year], 66 y.o., female Today's Date: 01/25/2022   PT End of Session - 01/25/22 1257     Visit Number 1    Number of Visits --   1-2x/week   Date for PT Re-Evaluation 03/22/22    Authorization Type [employer] MCD    Authorization - Number of Visits 27    Progress Note Due on Visit --    PT Start Time 1255    PT Stop Time 1333    PT Time Calculation (min) 38 min             Past Medical History:  Diagnosis Date   Arthritis    Closed fracture of tibial plateau 12/04/2011   Hypertension    Past Surgical History:  Procedure Laterality Date   TOTAL KNEE ARTHROPLASTY Right 04/05/2014   Procedure: TOTAL KNEE ARTHROPLASTY;  Surgeon: Dannielle Huh, MD;  Location: [facility];  Service: Orthopedics;  Laterality: Right;   TOTAL KNEE ARTHROPLASTY Left 05/28/2016   Procedure: TOTAL KNEE ARTHROPLASTY;  Surgeon: Dannielle Huh, MD;  Location: [facility];  Service: Orthopedics;  Laterality: Left;   Patient Active Problem List   Diagnosis Date Noted   Lumbar degenerative disc disease 07/25/2021   Lumbar spondylosis 07/25/2021   Colon cancer screening 04/03/2021   Chronic hand pain 01/31/2021   Lumbago with sciatica, left side 01/02/2021   Gastroesophageal reflux disease without esophagitis 09/25/2017   Mixed hyperlipidemia 09/25/2017   Vision changes 10/31/2016   Class 1 obesity due to excess calories with body mass index (BMI) of 34.0 to 34.9 in adult 11/29/2015   Prediabetes 11/29/2015   Essential hypertension 11/07/2015   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 Yes, Number of falls: 3-4 (denies dizziness)  LIVING ENVIRONMENT: Lives with: lives with their family Stairs: Yes; Internal: 10 steps; on right going up  MOI/History of condition:  Onset date: chronic, "years"  Savannah Reese is a 66 y.o. female who presents to clinic with chief complaint of chronic low back pain with n/t and pain to both feet.  She also has weakness in both legs.  Slow onset over time with no injury.  Easier to sit down, with more pain while standing.  From referring provider:   "This a 66 year old female who is originally from [place] Patient seen by way of video [place] interpreter Savannah Reese [phone] At the last visit in December we try to get her physical therapy they tried calling there was no answer and no way to leave a voicemail.  Patient complains that no one called her for her physical therapy but I suspect there is been a breakdown of communication.  She does speak [place] but I do not think understands [place] and certainly does not understand English extremely well.   Her son who was to be with her today had to leave early  and I saw her with the interpreter alone.  She had an MRI performed a year ago which showed severe lumbar disease they recommended injection she refused the injections so now physical therapy needs to occur."   Red flags:  denies   Pain:  Are you having pain? Yes Pain location: back and bil LE NPRS scale:  current 10/10  Aggravating factors: standing, walking  NPRS, highest: 10/10 Relieving factors: sitting  NPRS: best: 6/10 Pain description: intermittent, constant, sharp, and aching Stage: Chronic Stability: staying the same 24 hour pattern: NA  Occupation:  NA  Assistive Device: SPC  Patient Goals/Specific Activities: "feel better"   PLOF: Independent  DIAGNOSTIC FINDINGS:   MRI 2022  IMPRESSION: 1. Advanced degenerative changes of the lumbar spine with multilevel high-grade neural foraminal narrowing, severe on the left at L4-5 and bilaterally at L5-S1. 2. Mild spinal canal stenosis at L4-5 and mild-to-moderate at L5-S1   OBJECTIVE:   GENERAL OBSERVATION/GAIT:  Slow antalgic gait, using SPC  SENSATION:  Light touch: Appears intact on exam, subjective reports of n/t after standing for long periods  MUSCLE LENGTH: Hamstrings: Right no restriction; Left no restriction ASLR: Right ASLR = PSLR; Left ASLR = PSLR Thomas test: Right minimal restriction; Left minimal restriction  LUMBAR AROM  AROM AROM  01/25/2022  Flexion WNL  Extension WNL, w/ concordant pain  Right lateral flexion WNL  Left lateral flexion limited by 25%, w/ concordant pain  Right rotation limited by 50%, w/ concordant pain  Left rotation WNL, w/ concordant pain    (Blank rows = not tested)  DIRECTIONAL PREFERENCE:  NA  LE MMT:  MMT Right 01/25/2022 Left 01/25/2022  Hip flexion (L2, L3) C C  Knee extension (L3) C C  Knee flexion    Hip abduction    Hip extension    Hip external rotation    Hip internal rotation    Hip adduction    Ankle dorsiflexion (L4) Diminished from past injury C  Ankle plantarflexion (S1) Diminished from past injury   C  Ankle inversion    Ankle eversion    Great Toe ext (L5) Diminished from past injury   C  Grossly     (Blank rows = not tested, score listed is out of 5 possible points.  N = WNL, D = diminished, C = clear for gross weakness with myotome testing, * = concordant pain with testing)   Functional Tests  Eval (01/25/2022)    30'' STS: 9x  UE used? Y    10 m max gait speed: 11'', .91 m/s, AD: SPC    Sustained supine bridge (dominant leg extended at 120'', if reached): unable with full arc d/t  weakness and pain (norm 170'')                                               LUMBAR SPECIAL TESTS:  Straight leg raise: L (+), R (+) Slump: L (+), R (+)  PATIENT SURVEYS:  ODI   TODAY'S TREATMENT  Creating, reviewing, and completing below HEP  PATIENT EDUCATION:  POC, diagnosis, prognosis, HEP, and outcome measures.  Pt educated via explanation, demonstration, and handout (HEP).  Pt confirms understanding verbally.   ASTERISK SIGNS   Asterisk Signs Eval (01/25/2022)       30'' STS 9x w/ UE support       Supine bridge  unable       pain Average 9/10                         HOME EXERCISE PROGRAM: Access Code: [email or web address] URL: [email or web address] Date: 01/25/2022 Prepared by: Alphonzo SeveranceKarl Jeffrie Stander  Exercises - Supine Hip Adduction Isometric with Ball  - 1 x daily - 7 x weekly - 2 sets - 10 reps - 10'' hold - Hooklying Clamshell with Resistance  - 1 x daily - 7 x weekly - 3 sets - 10 reps  ASSESSMENT:  CLINICAL IMPRESSION: Savannah Reese is a 66 y.o. female who presents to clinic with signs and sxs consistent with low back pain with radicular sxs consistent secondary to stenosis.  Given significant stenosis, both central and lateral, she is in a difficult situation.  She does have significant core weakness.  She is hypermobile.  She will benefit from core and hip strengthening to improve function.    OBJECTIVE IMPAIRMENTS: Pain, lumbar ROM, LE and hip, gait, balance  ACTIVITY LIMITATIONS: standing, walking, lifting  PERSONAL FACTORS: See medical history and pertinent history   REHAB POTENTIAL: Fair chronic  CLINICAL DECISION MAKING: Evolving/moderate complexity  EVALUATION COMPLEXITY: Moderate   GOALS:   SHORT TERM GOALS: Target date: 02/15/2022  Savannah Reese will be >75% HEP compliant to improve carryover between sessions and facilitate independent management of condition  Evaluation (01/25/2022): ongoing Goal status: INITIAL   LONG TERM GOALS:  Target date: 03/22/2022  Savannah Reese will self report >/= 50% decrease in pain from evaluation   Evaluation/Baseline (01/25/2022): 10/10 max pain Goal status: INITIAL   2.  Savannah Reese will improve 30'' STS (MCID 2) to >/= 12x (w/ UE?: Y) to show improved LE strength and improved transfers   Evaluation/Baseline (01/25/2022): 9x  w/ UE? Y Goal status: INITIAL   3.  Savannah Reese will improve 10 meter max gait speed to 1.1 m/s (.1 m/s MCID) to show functional improvement in ambulation   Evaluation/Baseline (01/25/2022): .91 m/s Goal status: INITIAL   Norms:      PLAN: PT FREQUENCY: 1-2x/week  PT DURATION: 8 weeks (Ending 03/22/2022)  PLANNED INTERVENTIONS: Therapeutic exercises, Aquatic therapy, Therapeutic activity, Neuro Muscular re-education, Gait training, Patient/Family education, Joint mobilization, Dry Needling, Electrical stimulation, Spinal mobilization and/or manipulation, Moist heat, Taping, Vasopneumatic device, Ionotophoresis 4mg /ml Dexamethasone, and Manual therapy  PLAN FOR NEXT SESSION: progressive core and hip strengthening in neutral and flexed positions, further discuss prognosis and possible benefits of injection or ablation, dural stretching   Alphonzo SeveranceKarl Vanna Sailer PT, DPT 01/25/2022, 1:34 PM

## 2022-01-31 ENCOUNTER — Telehealth: Payer: Self-pay | Admitting: Physical Therapy

## 2022-01-31 ENCOUNTER — Encounter: Payer: Medicaid Other | Admitting: Physical Therapy

## 2022-01-31 NOTE — Therapy (Deleted)
OUTPATIENT PHYSICAL THERAPY TREATMENT NOTE   Patient Name: Savannah Reese MRN: [id number] DOB:1955-12-18, 66 y.o., female Today's Date: 01/31/2022  PCP: Storm Frisk, MD REFERRING PROVIDER: Storm Frisk, MD    Past Medical History:  Diagnosis Date   Arthritis    Closed fracture of tibial plateau 12/04/2011   Hypertension    Past Surgical History:  Procedure Laterality Date   TOTAL KNEE ARTHROPLASTY Right 04/05/2014   Procedure: TOTAL KNEE ARTHROPLASTY;  Surgeon: Dannielle Huh, MD;  Location: [facility];  Service: Orthopedics;  Laterality: Right;   TOTAL KNEE ARTHROPLASTY Left 05/28/2016   Procedure: TOTAL KNEE ARTHROPLASTY;  Surgeon: Dannielle Huh, MD;  Location: [facility];  Service: Orthopedics;  Laterality: Left;   Patient Active Problem List   Diagnosis Date Noted   Lumbar degenerative disc disease 07/25/2021   Lumbar spondylosis 07/25/2021   Colon cancer screening 04/03/2021   Chronic hand pain 01/31/2021   Lumbago with sciatica, left side 01/02/2021   Gastroesophageal reflux disease without esophagitis 09/25/2017   Mixed hyperlipidemia 09/25/2017   Vision changes 10/31/2016   Class 1 obesity due to excess calories with body mass index (BMI) of 34.0 to 34.9 in adult 11/29/2015   Prediabetes 11/29/2015   Essential hypertension 11/07/2015   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  REFERRING DIAG: Low back pain  PERTINENT HISTORY: Hx of falls    PRECAUTIONS/RESTRICTIONS:   Hx of falls    SUBJECTIVE:  ***  Pain:  Are you having pain? Yes Pain location: back and bil LE NPRS scale:  current 10/10  Aggravating factors: standing, walking Relieving factors: sitting Pain description: intermittent, constant, sharp, and aching Stage: Chronic  OBJECTIVE:  MRI 2022   IMPRESSION: 1. Advanced degenerative changes of the lumbar spine with multilevel high-grade neural  foraminal narrowing, severe on the left at L4-5 and bilaterally at L5-S1. 2. Mild spinal canal stenosis at L4-5 and mild-to-moderate at L5-S1  GENERAL OBSERVATION/GAIT:           Slow antalgic gait, using SPC   SENSATION:          Light touch: Appears intact on exam, subjective reports of n/t after standing for long periods   MUSCLE LENGTH: Hamstrings: Right no restriction; Left no restriction ASLR: Right ASLR = PSLR; Left ASLR = PSLR Thomas test: Right minimal restriction; Left minimal restriction   LUMBAR AROM   AROM AROM  01/25/2022  Flexion WNL  Extension WNL, w/ concordant pain  Right lateral flexion WNL  Left lateral flexion limited by 25%, w/ concordant pain  Right rotation limited by 50%, w/ concordant pain  Left rotation WNL, w/ concordant pain    (Blank rows = not tested)   DIRECTIONAL PREFERENCE:           NA   LE MMT:   MMT Right 01/25/2022 Left 01/25/2022  Hip flexion (L2, L3) C C  Knee extension (L3) C C  Knee flexion      Hip abduction      Hip extension      Hip external rotation      Hip internal rotation      Hip adduction      Ankle dorsiflexion (L4) Diminished from past injury C  Ankle plantarflexion (S1) Diminished from past injury   C  Ankle inversion      Ankle eversion      Great Toe ext (L5)  Diminished from past injury   C  Grossly        (Blank rows = not tested, score listed is out of 5 possible points.  N = WNL, D = diminished, C = clear for gross weakness with myotome testing, * = concordant pain with testing)     Functional Tests   Eval (01/25/2022)      30'' STS: 9x  UE used? Y      10 m max gait speed: 11'', .91 m/s, AD: SPC      Sustained supine bridge (dominant leg extended at 120'', if reached): unable with full arc d/t weakness and pain (norm 170'')                                                                                 LUMBAR SPECIAL TESTS:  Straight leg raise: L (+), R (+) Slump: L (+), R (+)    PATIENT SURVEYS:  ODI     TODAY'S TREATMENT  Creating, reviewing, and completing below HEP   PATIENT EDUCATION:  POC, diagnosis, prognosis, HEP, and outcome measures.  Pt educated via explanation, demonstration, and handout (HEP).  Pt confirms understanding verbally.      HOME EXERCISE PROGRAM: Access Code: [email or web address] URL: [email or web address] Date: 01/25/2022 Prepared by: Alphonzo Severance   Exercises - Supine Hip Adduction Isometric with Ball  - 1 x daily - 7 x weekly - 2 sets - 10 reps - 10'' hold - Hooklying Clamshell with Resistance  - 1 x daily - 7 x weekly - 3 sets - 10 reps   ASTERISK SIGNS     Asterisk Signs Eval (01/25/2022)            30'' STS 9x w/ UE support            Supine bridge unable            pain Average 9/10                                              TREATMENT 5/31:  Therapeutic Exercise: - ***  Manual Therapy: - ***  Neuromuscular re-ed: - ***  Therapeutic Activity: - ***  Self-care/Home Management: - ***   ASSESSMENT:   CLINICAL IMPRESSION: ***   OBJECTIVE IMPAIRMENTS: Pain, lumbar ROM, LE and hip, gait, balance   ACTIVITY LIMITATIONS: standing, walking, lifting   PERSONAL FACTORS: See medical history and pertinent history     REHAB POTENTIAL: Fair chronic   CLINICAL DECISION MAKING: Evolving/moderate complexity   EVALUATION COMPLEXITY: Moderate     GOALS:     SHORT TERM GOALS: Target date: 02/15/2022   Allie will be >75% HEP compliant to improve carryover between sessions and facilitate independent management of condition   Evaluation (01/25/2022): ongoing Goal status: INITIAL     LONG TERM GOALS: Target date: 03/22/2022   Charonda will self report >/= 50% decrease in pain from evaluation    Evaluation/Baseline (01/25/2022): 10/10 max pain Goal status: INITIAL     2.  Brylea will improve 30'' STS (MCID 2) to >/= 12x (  w/ UE?: Y) to show improved LE strength and improved transfers     Evaluation/Baseline (01/25/2022): 9x  w/ UE? Y Goal status: INITIAL     3.  Laquinda will improve 10 meter max gait speed to 1.1 m/s (.1 m/s MCID) to show functional improvement in ambulation    Evaluation/Baseline (01/25/2022): .91 m/s Goal status: INITIAL     Norms:         PLAN: PT FREQUENCY: 1-2x/week   PT DURATION: 8 weeks (Ending 03/22/2022)   PLANNED INTERVENTIONS: Therapeutic exercises, Aquatic therapy, Therapeutic activity, Neuro Muscular re-education, Gait training, Patient/Family education, Joint mobilization, Dry Needling, Electrical stimulation, Spinal mobilization and/or manipulation, Moist heat, Taping, Vasopneumatic device, Ionotophoresis 4mg /ml Dexamethasone, and Manual therapy   PLAN FOR NEXT SESSION: progressive core and hip strengthening in neutral and flexed positions, further discuss prognosis and possible benefits of injection or ablation, dural stretching   Kimberlee NearingKarl E Tecumseh Yeagley PT 01/31/2022, 9:47 AM

## 2022-01-31 NOTE — Telephone Encounter (Signed)
Attempted to call pt about missed visit, but was unable to reach anyone and no VM set up.

## 2022-02-09 ENCOUNTER — Ambulatory Visit: Payer: Medicaid Other | Attending: Critical Care Medicine | Admitting: Physical Therapy

## 2022-02-09 ENCOUNTER — Encounter: Payer: Self-pay | Admitting: Physical Therapy

## 2022-02-09 DIAGNOSIS — G8929 Other chronic pain: Secondary | ICD-10-CM | POA: Diagnosis present

## 2022-02-09 DIAGNOSIS — M545 Low back pain, unspecified: Secondary | ICD-10-CM | POA: Insufficient documentation

## 2022-02-09 DIAGNOSIS — R2681 Unsteadiness on feet: Secondary | ICD-10-CM | POA: Insufficient documentation

## 2022-02-09 DIAGNOSIS — M6281 Muscle weakness (generalized): Secondary | ICD-10-CM | POA: Insufficient documentation

## 2022-02-09 DIAGNOSIS — R2689 Other abnormalities of gait and mobility: Secondary | ICD-10-CM | POA: Insufficient documentation

## 2022-02-09 NOTE — Therapy (Signed)
OUTPATIENT PHYSICAL THERAPY TREATMENT NOTE   Patient Name: Savannah CoachMaimouna Aina MRN: [id number] DOB:06/22/1956, 66 y.o., female Today's Date: 02/09/2022  PCP: Storm FriskWright, Patrick E, MD REFERRING PROVIDER: Storm FriskWright, Patrick E, MD   PT End of Session - 02/09/22 [phone]     Visit Number 2    Number of Visits --   1-2x/week   Date for PT Re-Evaluation 03/22/22    Authorization Type [employer] MCD    Authorization - Number of Visits 27    PT Start Time 0925    PT Stop Time 0955    PT Time Calculation (min) 30 min             Past Medical History:  Diagnosis Date   Arthritis    Closed fracture of tibial plateau 12/04/2011   Hypertension    Past Surgical History:  Procedure Laterality Date   TOTAL KNEE ARTHROPLASTY Right 04/05/2014   Procedure: TOTAL KNEE ARTHROPLASTY;  Surgeon: Dannielle HuhSteve Lucey, MD;  Location: [facility];  Service: Orthopedics;  Laterality: Right;   TOTAL KNEE ARTHROPLASTY Left 05/28/2016   Procedure: TOTAL KNEE ARTHROPLASTY;  Surgeon: Dannielle HuhSteve Lucey, MD;  Location: [facility];  Service: Orthopedics;  Laterality: Left;   Patient Active Problem List   Diagnosis Date Noted   Lumbar degenerative disc disease 07/25/2021   Lumbar spondylosis 07/25/2021   Colon cancer screening 04/03/2021   Chronic hand pain 01/31/2021   Lumbago with sciatica, left side 01/02/2021   Gastroesophageal reflux disease without esophagitis 09/25/2017   Mixed hyperlipidemia 09/25/2017   Vision changes 10/31/2016   Class 1 obesity due to excess calories with body mass index (BMI) of 34.0 to 34.9 in adult 11/29/2015   Prediabetes 11/29/2015   Essential hypertension 11/07/2015   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  She has been HEP compliant.  Pain:  Are you having pain? Yes Pain location: back and bil LE NPRS scale:  current 10/10  Aggravating factors: standing, walking Relieving factors: sitting Pain description: intermittent, constant, sharp, and aching Stage: Chronic  OBJECTIVE:  MRI 2022   IMPRESSION: 1. Advanced degenerative changes of the lumbar spine with multilevel high-grade neural foraminal narrowing, severe on the left at L4-5 and bilaterally at L5-S1. 2. Mild spinal canal stenosis at L4-5 and mild-to-moderate at L5-S1  GENERAL OBSERVATION/GAIT:           Slow antalgic gait, using SPC   SENSATION:          Light touch: Appears intact on exam, subjective reports of n/t after standing for long periods   MUSCLE LENGTH: Hamstrings: Right no restriction; Left no restriction ASLR: Right ASLR = PSLR; Left ASLR = PSLR Thomas test: Right minimal restriction; Left minimal restriction   LUMBAR AROM   AROM AROM  01/25/2022  Flexion WNL  Extension WNL, w/ concordant pain  Right lateral flexion WNL  Left lateral flexion limited by 25%, w/ concordant pain  Right rotation limited by 50%, w/ concordant pain  Left rotation WNL, w/ concordant pain    (Blank rows = not tested)   DIRECTIONAL PREFERENCE:  NA   LE MMT:   MMT Right 01/25/2022 Left 01/25/2022  Hip flexion (L2, L3) C C  Knee extension (L3) C C  Knee flexion      Hip abduction      Hip extension      Hip external rotation      Hip internal rotation      Hip adduction      Ankle dorsiflexion (L4) Diminished from past injury C  Ankle plantarflexion (S1) Diminished from past injury   C  Ankle inversion      Ankle eversion      Great Toe ext (L5) Diminished from past injury   C  Grossly        (Blank rows = not tested, score listed is out of 5 possible points.  N = WNL, D =  diminished, C = clear for gross weakness with myotome testing, * = concordant pain with testing)     Functional Tests   Eval (01/25/2022)      30'' STS: 9x  UE used? Y      10 m max gait speed: 11'', .91 m/s, AD: SPC      Sustained supine bridge (dominant leg extended at 120'', if reached): unable with full arc d/t weakness and pain (norm 170'')                                                                                 LUMBAR SPECIAL TESTS:  Straight leg raise: L (+), R (+) Slump: L (+), R (+)   PATIENT SURVEYS:  ODI     TODAY'S TREATMENT  Creating, reviewing, and completing below HEP   PATIENT EDUCATION:  POC, diagnosis, prognosis, HEP, and outcome measures.  Pt educated via explanation, demonstration, and handout (HEP).  Pt confirms understanding verbally.      HOME EXERCISE PROGRAM: Access Code: [email or web address] URL: [email or web address] Date: 02/09/2022 Prepared by: Alphonzo Severance  Exercises - Supine Hip Adduction Isometric with Ball  - 1 x daily - 7 x weekly - 2 sets - 10 reps - 10'' hold - Hooklying Clamshell with Resistance  - 1 x daily - 7 x weekly - 3 sets - 10 reps - Small Range Straight Leg Raise  - 1 x daily - 7 x weekly - 3 sets - 10 reps - Bridge  - 1 x daily - 7 x weekly - 2 sets - 10 reps   ASTERISK SIGNS     Asterisk Signs Eval (01/25/2022)  6/9          30'' STS 9x w/ UE support            Supine bridge unable            pain Average 9/10  7/10                                            TREATMENT 5/31:  Therapeutic Exercise: - nu-step L5 34m while taking subjective and planning session with patient - bridge - 3x10 - small arc - supine clam - black TB -  3x10 - pilates ring squeeze - 3x10 - SLR - 2x10 ea    ASSESSMENT:   CLINICAL IMPRESSION: Tequita did well with therapy.  She reports reduction in baseline pain and is able to complete all exercises with good form.  She arrived late and session was truncated.  HEP  updated.    OBJECTIVE IMPAIRMENTS: Pain, lumbar ROM, LE and hip, gait, balance   ACTIVITY LIMITATIONS: standing, walking, lifting   PERSONAL FACTORS: See medical history and pertinent history     REHAB POTENTIAL: Fair chronic   CLINICAL DECISION MAKING: Evolving/moderate complexity   EVALUATION COMPLEXITY: Moderate     GOALS:     SHORT TERM GOALS: Target date: 02/15/2022   Hayslee will be >75% HEP compliant to improve carryover between sessions and facilitate independent management of condition   Evaluation (01/25/2022): ongoing Goal status: INITIAL     LONG TERM GOALS: Target date: 03/22/2022   Lavone will self report >/= 50% decrease in pain from evaluation    Evaluation/Baseline (01/25/2022): 10/10 max pain Goal status: INITIAL     2.  Retaj will improve 30'' STS (MCID 2) to >/= 12x (w/ UE?: Y) to show improved LE strength and improved transfers    Evaluation/Baseline (01/25/2022): 9x  w/ UE? Y Goal status: INITIAL     3.  Avari will improve 10 meter max gait speed to 1.1 m/s (.1 m/s MCID) to show functional improvement in ambulation    Evaluation/Baseline (01/25/2022): .91 m/s Goal status: INITIAL     Norms:         PLAN: PT FREQUENCY: 1-2x/week   PT DURATION: 8 weeks (Ending 03/22/2022)   PLANNED INTERVENTIONS: Therapeutic exercises, Aquatic therapy, Therapeutic activity, Neuro Muscular re-education, Gait training, Patient/Family education, Joint mobilization, Dry Needling, Electrical stimulation, Spinal mobilization and/or manipulation, Moist heat, Taping, Vasopneumatic device, Ionotophoresis 4mg /ml Dexamethasone, and Manual therapy   PLAN FOR NEXT SESSION: progressive core and hip strengthening in neutral and flexed positions, further discuss prognosis and possible benefits of injection or ablation, dural stretching   Sharilyn Geisinger PT 02/09/2022, 9:59 AM

## 2022-02-13 ENCOUNTER — Other Ambulatory Visit: Payer: Self-pay

## 2022-02-13 ENCOUNTER — Other Ambulatory Visit: Payer: Self-pay | Admitting: Critical Care Medicine

## 2022-02-14 ENCOUNTER — Other Ambulatory Visit: Payer: Self-pay

## 2022-02-14 ENCOUNTER — Encounter: Payer: Self-pay | Admitting: Physical Therapy

## 2022-02-14 ENCOUNTER — Ambulatory Visit: Payer: Medicaid Other | Admitting: Physical Therapy

## 2022-02-14 DIAGNOSIS — R2681 Unsteadiness on feet: Secondary | ICD-10-CM

## 2022-02-14 DIAGNOSIS — M545 Low back pain, unspecified: Secondary | ICD-10-CM

## 2022-02-14 DIAGNOSIS — M6281 Muscle weakness (generalized): Secondary | ICD-10-CM

## 2022-02-14 DIAGNOSIS — R2689 Other abnormalities of gait and mobility: Secondary | ICD-10-CM

## 2022-02-14 DIAGNOSIS — G8929 Other chronic pain: Secondary | ICD-10-CM

## 2022-02-14 MED ORDER — MELOXICAM 15 MG PO TABS
15.0000 mg | ORAL_TABLET | Freq: Every day | ORAL | 0 refills | Status: DC
  Filled 2022-02-14: qty 30, 30d supply, fill #0

## 2022-02-14 NOTE — Therapy (Signed)
OUTPATIENT PHYSICAL THERAPY TREATMENT NOTE   Patient Name: Savannah Reese MRN: [id number] DOB:[birth year], 66 y.o., female Today's Date: 02/14/2022  PCP: Savannah Stain, MD REFERRING PROVIDER: Elsie Stain, MD   PT End of Session - 02/14/22 [id number]     Visit Number 3    Number of Visits --   1-2x/week   Date for PT Re-Evaluation 03/22/22    Authorization Type [employer] MCD    Authorization - Number of Visits 27    PT Start Time 0930   pt arrived late   PT Stop Time 0955    PT Time Calculation (min) 25 min             Past Medical History:  Diagnosis Date   Arthritis    Closed fracture of tibial plateau 12/04/2011   Hypertension    Past Surgical History:  Procedure Laterality Date   TOTAL KNEE ARTHROPLASTY Right 04/05/2014   Procedure: TOTAL KNEE ARTHROPLASTY;  Surgeon: Savannah Huger, MD;  Location: [facility];  Service: Orthopedics;  Laterality: Right;   TOTAL KNEE ARTHROPLASTY Left 05/28/2016   Procedure: TOTAL KNEE ARTHROPLASTY;  Surgeon: Savannah Huger, MD;  Location: [facility];  Service: Orthopedics;  Laterality: Left;   Patient Active Problem List   Diagnosis Date Noted   Lumbar degenerative disc disease 07/25/2021   Lumbar spondylosis 07/25/2021   Colon cancer screening 04/03/2021   Chronic hand pain 01/31/2021   Lumbago with sciatica, left side 01/02/2021   Gastroesophageal reflux disease without esophagitis 09/25/2017   Mixed hyperlipidemia 09/25/2017   Vision changes 10/31/2016   Class 1 obesity due to excess calories with body mass index (BMI) of 34.0 to 34.9 in adult 11/29/2015   Prediabetes 11/29/2015   Essential hypertension 11/07/2015   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  Pain:  Are you having pain? Yes Pain location: back and bil LE NPRS scale:  current 5/10  Aggravating factors: standing, walking Relieving factors: sitting Pain description: intermittent, constant, sharp, and aching Stage: Chronic  OBJECTIVE:  MRI 2022   IMPRESSION: 1. Advanced degenerative changes of the lumbar spine with multilevel high-grade neural foraminal narrowing, severe on the left at L4-5 and bilaterally at L5-S1. 2. Mild spinal canal stenosis at L4-5 and mild-to-moderate at L5-S1  GENERAL OBSERVATION/GAIT:           Slow antalgic gait, using SPC   SENSATION:          Light touch: Appears intact on exam, subjective reports of n/t after standing for long periods   MUSCLE LENGTH: Hamstrings: Right no restriction; Left no restriction ASLR: Right ASLR = PSLR; Left ASLR = PSLR Thomas test: Right minimal restriction; Left minimal restriction   LUMBAR AROM   AROM AROM  01/25/2022  Flexion WNL  Extension WNL, w/ concordant pain  Right lateral flexion WNL  Left lateral flexion limited by 25%, w/ concordant pain  Right rotation limited by 50%, w/ concordant pain  Left rotation WNL, w/ concordant pain    (Blank rows = not  tested)   DIRECTIONAL PREFERENCE:           NA   LE MMT:   MMT Right 01/25/2022 Left 01/25/2022  Hip flexion (L2, L3) C C  Knee extension (L3) C C  Knee flexion      Hip abduction      Hip extension      Hip external rotation      Hip internal rotation      Hip adduction      Ankle dorsiflexion (L4) Diminished from past injury C  Ankle plantarflexion (S1) Diminished from past injury   C  Ankle inversion      Ankle eversion      Great Toe ext (L5) Diminished from past injury   C  Grossly        (Blank rows = not  tested, score listed is out of 5 possible points.  N = WNL, D = diminished, C = clear for gross weakness with myotome testing, * = concordant pain with testing)     Functional Tests   Eval (01/25/2022)      30'' STS: 9x  UE used? Y      10 m max gait speed: 11'', .91 m/s, AD: SPC      Sustained supine bridge (dominant leg extended at 120'', if reached): unable with full arc d/t weakness and pain (norm 170'')                                                                                 LUMBAR SPECIAL TESTS:  Straight leg raise: L (+), R (+) Slump: L (+), R (+)   PATIENT SURVEYS:  ODI     TODAY'S TREATMENT  Creating, reviewing, and completing below HEP   PATIENT EDUCATION:  POC, diagnosis, prognosis, HEP, and outcome measures.  Pt educated via explanation, demonstration, and handout (HEP).  Pt confirms understanding verbally.      HOME EXERCISE PROGRAM: Access Code: [email or web address] URL: [email or web address] Date: 02/09/2022 Prepared by: Shearon Balo  Exercises - Supine Hip Adduction Isometric with Ball  - 1 x daily - 7 x weekly - 2 sets - 10 reps - 10'' hold - Hooklying Clamshell with Resistance  - 1 x daily - 7 x weekly - 3 sets - 10 reps - Small Range Straight Leg Raise  - 1 x daily - 7 x weekly - 3 sets - 10 reps - Bridge  - 1 x daily - 7 x weekly - 2 sets - 10 reps   ASTERISK SIGNS     Asterisk Signs Eval (01/25/2022)  6/9 6/14         30'' STS 9x w/ UE support            Supine bridge unable   3x10 small arc         pain Average 9/10  7/10 5/10                                          TREATMENT 6/14:  Therapeutic Exercise: - nu-step L5 62m- LE only - while taking  subjective and planning session with patient - bridge - 3x10 - small arc - S/L clam - green TB - 3x10 - isometric green swiss ball squeeze - 10x - pilates ring squeeze - 3x10 - SLR - 2x10 ea (NT)  TREATMENT 5/31:  Therapeutic Exercise: - nu-step L5 10mwhile taking  subjective and planning session with patient - bridge - 3x10 - small arc - supine clam - black TB - 3x10 - pilates ring squeeze - 3x10 - SLR - 2x10 ea    ASSESSMENT:   CLINICAL IMPRESSION: Savannah Reese continues to show improvement in her back pain.  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  May consider DF assist at some point.  Will continue core progression as able.  She arrived late and session was truncated.  HEP updated.    OBJECTIVE IMPAIRMENTS: Pain, lumbar ROM, LE and hip, gait, balance   ACTIVITY LIMITATIONS: standing, walking, lifting   PERSONAL FACTORS: See medical history and pertinent history     REHAB POTENTIAL: Fair chronic   CLINICAL DECISION MAKING: Evolving/moderate complexity   EVALUATION COMPLEXITY: Moderate     GOALS:     SHORT TERM GOALS: Target date: 02/15/2022   MKaterrawill be >75% HEP compliant to improve carryover between sessions and facilitate independent management of condition   Evaluation (01/25/2022): ongoing Goal status: Met 6/14     LONG TERM GOALS: Target date: 03/22/2022   MHelemwill self report >/= 50% decrease in pain from evaluation    Evaluation/Baseline (01/25/2022): 10/10 max pain Goal status: INITIAL     2.  Jadea will improve 30'' STS (MCID 2) to >/= 12x (w/ UE?: Y) to show improved LE strength and improved transfers    Evaluation/Baseline (01/25/2022): 9x  w/ UE? Y Goal status: INITIAL     3.  Yi will improve 10 meter max gait speed to 1.1 m/s (.1 m/s MCID) to show functional improvement in ambulation    Evaluation/Baseline (01/25/2022): .91 m/s Goal status: INITIAL     [place]orms:         PLAN: PT FREQUENCY: 1-2x/week   PT DURATION: 8 weeks (Ending 03/22/2022)   PLANNED INTERVENTIONS: Therapeutic exercises, Aquatic therapy, Therapeutic activity, Neuro Muscular re-education, Gait training, Patient/Family education, Joint mobilization, Dry Needling, Electrical stimulation, Spinal mobilization  and/or manipulation, Moist heat, Taping, Vasopneumatic device, Ionotophoresis 426mml Dexamethasone, and Manual therapy   PLAN FOR NEXT SESSION: progressive core and hip strengthening in neutral and flexed positions, further discuss prognosis and possible benefits of injection or ablation, dural stretching   KaKevan Nyeinhartsen PT 02/14/2022, 9:59 AM

## 2022-02-14 NOTE — Telephone Encounter (Signed)
Requested medication (s) are due for refill today -yes  Requested medication (s) are on the active medication list -yes  Future visit scheduled -no  Last refill: 01/01/22 #30  Notes to clinic: fails lab protocol- over 1 year 11/29/20  Requested Prescriptions  Pending Prescriptions Disp Refills   meloxicam (MOBIC) 15 MG tablet 30 tablet 0    Sig: Take 1 tablet (15 mg total) by mouth daily.     Analgesics:  COX2 Inhibitors Failed - 02/13/2022  1:49 PM      Failed - Manual Review: Labs are only required if the patient has taken medication for more than 8 weeks.      Failed - HGB in normal range and within 360 days    Hemoglobin  Date Value Ref Range Status  11/29/2020 14.0 11.1 - 15.9 g/dL Final         Failed - Cr in normal range and within 360 days    Creatinine, Ser  Date Value Ref Range Status  11/29/2020 0.81 0.57 - 1.00 mg/dL Final         Failed - HCT in normal range and within 360 days    Hematocrit  Date Value Ref Range Status  11/29/2020 43.2 34.0 - 46.6 % Final         Failed - AST in normal range and within 360 days    AST  Date Value Ref Range Status  11/29/2020 16 0 - 40 IU/L Final         Failed - ALT in normal range and within 360 days    ALT  Date Value Ref Range Status  11/29/2020 10 0 - 32 IU/L Final         Failed - eGFR is 30 or above and within 360 days    GFR calc Af Amer  Date Value Ref Range Status  03/21/2020 63 >59 mL/min/1.73 Final    Comment:    **[facility] currently reports eGFR in compliance with the current**   recommendations of the [employer]. [facility] will   update reporting as new guidelines are published from the NKF-ASN   Task force.    GFR calc non Af Amer  Date Value Ref Range Status  03/21/2020 55 (L) >59 mL/min/1.73 Final   eGFR  Date Value Ref Range Status  11/29/2020 80 >59 mL/min/1.73 Final         Passed - Patient is not pregnant      Passed - Valid encounter within last 12 months    Recent  Outpatient Visits           1 month ago Chronic bilateral low back pain with left-sided sciatica   [place], Patrick E, MD   5 months ago Essential hypertension   [place], Patrick E, MD   10 months ago Essential hypertension   [place] Elsie Stain, MD   1 year ago Essential hypertension   [place], Patrick E, MD   1 year ago Encounter for screening mammogram for malignant neoplasm of breast   [place], MD                 Requested Prescriptions  Pending Prescriptions Disp Refills   meloxicam (MOBIC) 15 MG tablet 30 tablet 0    Sig: Take 1 tablet (15 mg total) by mouth daily.  Analgesics:  COX2 Inhibitors Failed - 02/13/2022  1:49 PM      Failed - Manual Review: Labs are only required if the patient has taken medication for more than 8 weeks.      Failed - HGB in normal range and within 360 days    Hemoglobin  Date Value Ref Range Status  11/29/2020 14.0 11.1 - 15.9 g/dL Final         Failed - Cr in normal range and within 360 days    Creatinine, Ser  Date Value Ref Range Status  11/29/2020 0.81 0.57 - 1.00 mg/dL Final         Failed - HCT in normal range and within 360 days    Hematocrit  Date Value Ref Range Status  11/29/2020 43.2 34.0 - 46.6 % Final         Failed - AST in normal range and within 360 days    AST  Date Value Ref Range Status  11/29/2020 16 0 - 40 IU/L Final         Failed - ALT in normal range and within 360 days    ALT  Date Value Ref Range Status  11/29/2020 10 0 - 32 IU/L Final         Failed - eGFR is 30 or above and within 360 days    GFR calc Af Amer  Date Value Ref Range Status  03/21/2020 63 >59 mL/min/1.73 Final    Comment:    **[facility] currently reports eGFR in compliance with the current**   recommendations of  the [employer]. [facility] will   update reporting as new guidelines are published from the NKF-ASN   Task force.    GFR calc non Af Amer  Date Value Ref Range Status  03/21/2020 55 (L) >59 mL/min/1.73 Final   eGFR  Date Value Ref Range Status  11/29/2020 80 >59 mL/min/1.73 Final         Passed - Patient is not pregnant      Passed - Valid encounter within last 12 months    Recent Outpatient Visits           1 month ago Chronic bilateral low back pain with left-sided sciatica   [place], Patrick E, MD   5 months ago Essential hypertension   [place], Patrick E, MD   10 months ago Essential hypertension   [place] Elsie Stain, MD   1 year ago Essential hypertension   [place], Patrick E, MD   1 year ago Encounter for screening mammogram for malignant neoplasm of breast   [place], Patrick E, MD

## 2022-02-15 ENCOUNTER — Other Ambulatory Visit: Payer: Self-pay

## 2022-02-16 ENCOUNTER — Encounter: Payer: Medicaid Other | Admitting: Physical Therapy

## 2022-02-19 ENCOUNTER — Ambulatory Visit: Payer: Medicaid Other | Admitting: Physical Therapy

## 2022-02-21 ENCOUNTER — Ambulatory Visit: Payer: Medicaid Other

## 2022-05-26 IMAGING — DX DG CHEST 2V
2 series · 2 of 2 positions shown · non-contrast
Comparison: 04/22/2019

CLINICAL DATA: Chronic cough

EXAM:
CHEST - 2 VIEW

[dg chest 2 view (1 of 2)]
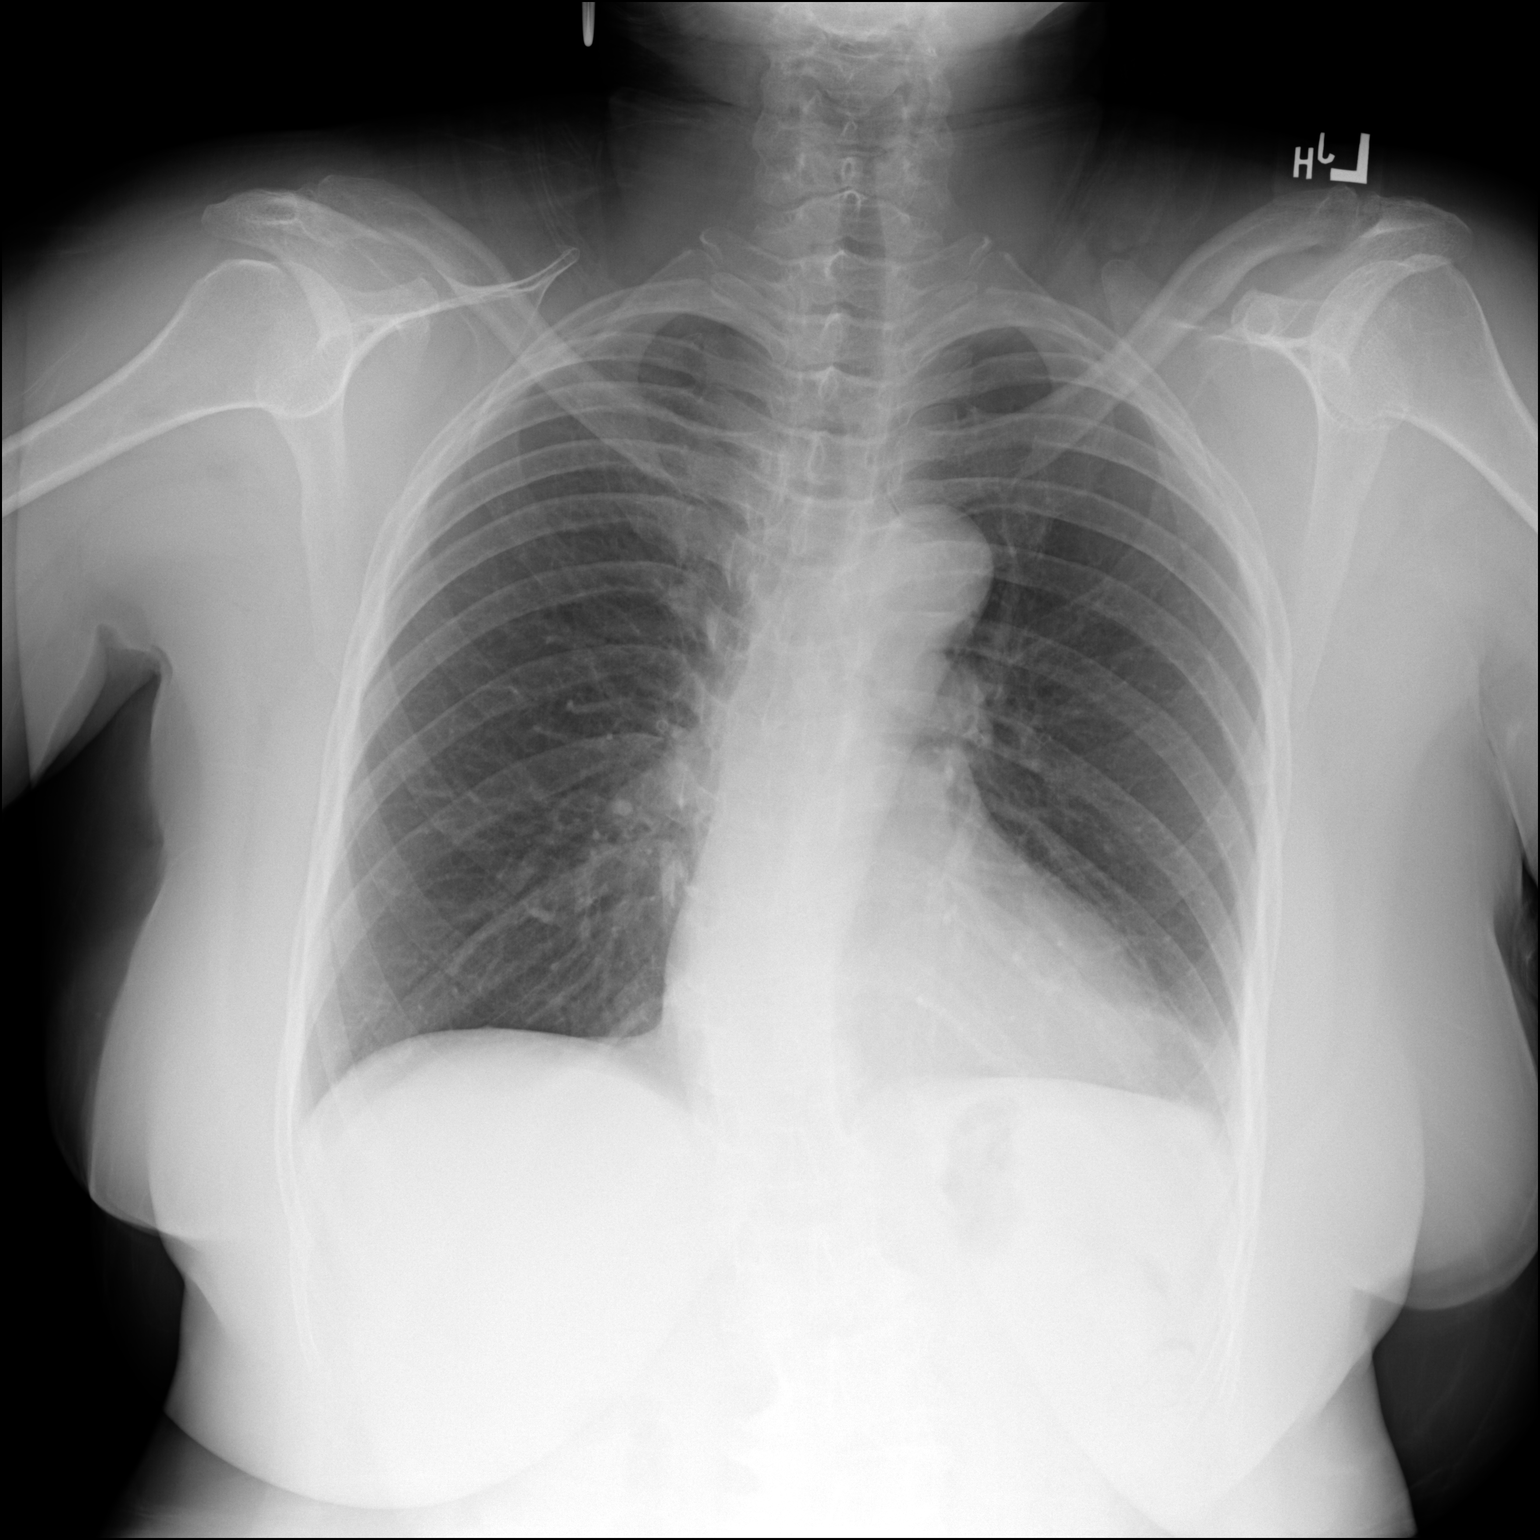

[dg chest 2 view (2 of 2)]
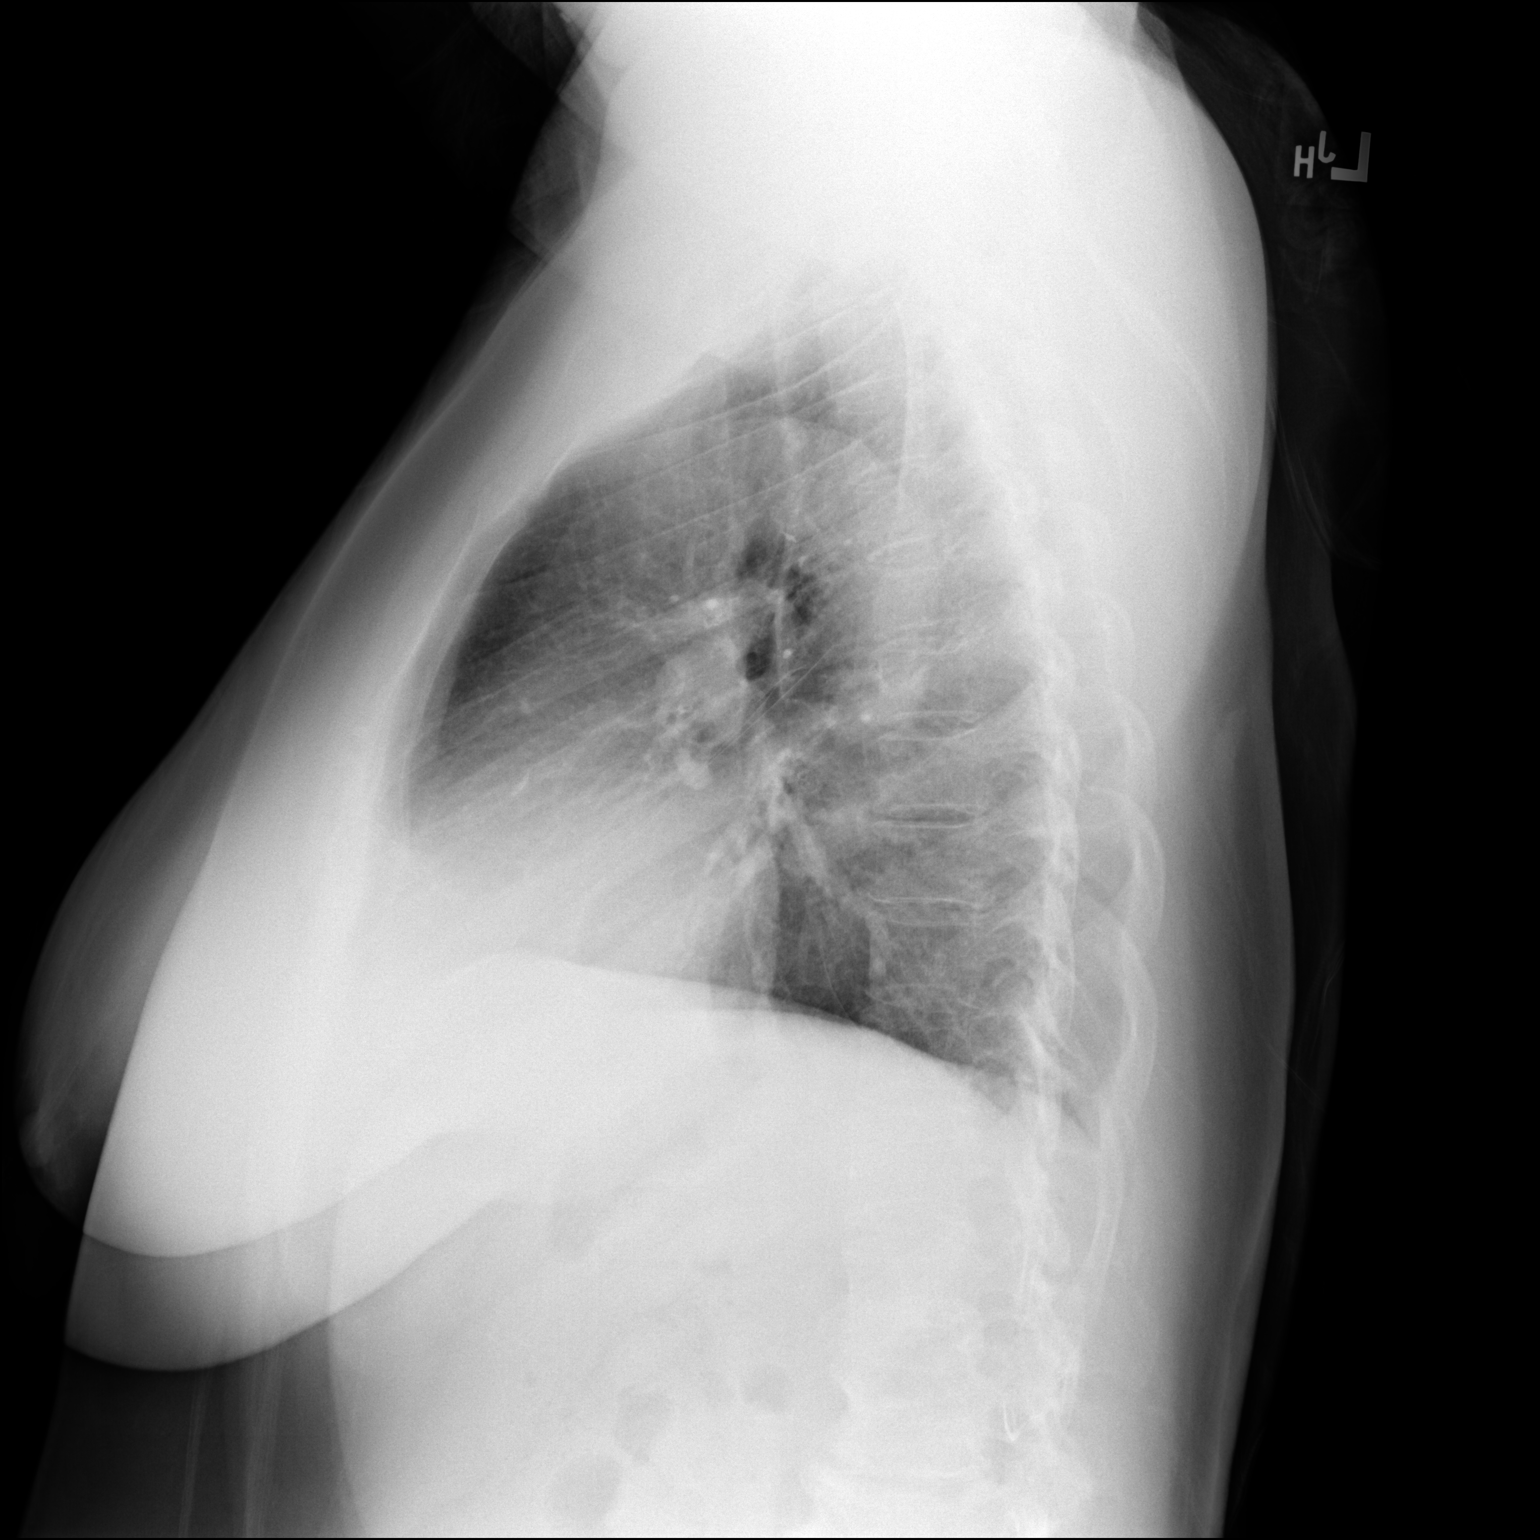

[2 of 2 positions shown; findings below may reference images not displayed]

FINDINGS: The heart size and mediastinal contours are within normal limits.
Both lungs are clear. The visualized skeletal structures are
unremarkable.
IMPRESSION: No active cardiopulmonary disease.

## 2022-07-30 ENCOUNTER — Other Ambulatory Visit: Payer: Self-pay

## 2022-07-31 ENCOUNTER — Other Ambulatory Visit: Payer: Self-pay

## 2022-08-01 ENCOUNTER — Other Ambulatory Visit: Payer: Self-pay

## 2022-08-02 ENCOUNTER — Other Ambulatory Visit: Payer: Self-pay

## 2022-11-16 ENCOUNTER — Other Ambulatory Visit: Payer: Self-pay

## 2022-11-16 ENCOUNTER — Other Ambulatory Visit: Payer: Self-pay | Admitting: Critical Care Medicine

## 2022-11-16 DIAGNOSIS — K219 Gastro-esophageal reflux disease without esophagitis: Secondary | ICD-10-CM

## 2022-11-16 DIAGNOSIS — E785 Hyperlipidemia, unspecified: Secondary | ICD-10-CM

## 2022-11-16 DIAGNOSIS — I10 Essential (primary) hypertension: Secondary | ICD-10-CM

## 2022-11-16 NOTE — Telephone Encounter (Signed)
Requested medication (s) are due for refill today: yes   Requested medication (s) are on the active medication list: yes   Last refill:  tenormin- 01/01/22 #90 1 refills, lipitor- 01/01/22 #90 1 refill, prilosec 01/01/22 #180 1 refill. Diovan 01/01/22 #90 2 refills   Future visit scheduled: no   Notes to clinic:   overdue encounter x 4 months. Last labs 11/29/20 protocol failed. Called patient to schedule appt via interpreter [phone]. No answer on [phone]. LVMTCB . Do you want to refill Rxs?     Requested Prescriptions  Pending Prescriptions Disp Refills   atenolol (TENORMIN) 100 MG tablet 90 tablet 1    Sig: TAKE 1 TABLET (100 MG TOTAL) BY MOUTH ONCE DAILY. TO LOWER BLOOD PRESSURE     Cardiovascular: Beta Blockers 2 Failed - 11/16/2022  3:35 PM      Failed - Cr in normal range and within 360 days    Creatinine, Ser  Date Value Ref Range Status  11/29/2020 0.81 0.57 - 1.00 mg/dL Final         Failed - Valid encounter within last 6 months    Recent Outpatient Visits           10 months ago Chronic bilateral low back pain with left-sided sciatica   [place] Elsie Stain, MD   1 year ago Essential hypertension   [place] Elsie Stain, MD   1 year ago Essential hypertension   [place] Elsie Stain, MD   1 year ago Essential hypertension   [place] Elsie Stain, MD   1 year ago Encounter for screening mammogram for malignant neoplasm of breast   [place] Elsie Stain, MD              Passed - Last BP in normal range    BP Readings from Last 1 Encounters:  01/01/22 132/84         Passed - Last Heart Rate in normal range    Pulse Readings from Last 1 Encounters:  01/01/22 76          atorvastatin (LIPITOR) 20 MG tablet 90 tablet 1    Sig: TAKE 1 TABLET (20  MG TOTAL) BY MOUTH ONCE DAILY. TO LOWER CHOLESTEROL     Cardiovascular:  Antilipid - Statins Failed - 11/16/2022  3:35 PM      Failed - Lipid Panel in normal range within the last 12 months    Cholesterol, Total  Date Value Ref Range Status  11/29/2020 207 (H) 100 - 199 mg/dL Final   LDL Chol Calc (NIH)  Date Value Ref Range Status  11/29/2020 139 (H) 0 - 99 mg/dL Final   HDL  Date Value Ref Range Status  11/29/2020 56 >39 mg/dL Final   Triglycerides  Date Value Ref Range Status  11/29/2020 66 0 - 149 mg/dL Final         Passed - Patient is not pregnant      Passed - Valid encounter within last 12 months    Recent Outpatient Visits           10 months ago Chronic bilateral low back pain with left-sided sciatica   [place] Elsie Stain, MD   1 year ago Essential hypertension   [facility], [facility]   1 year ago Essential hypertension   [place] Elsie Stain, MD   1 year ago Essential hypertension   [place] Elsie Stain, MD   1 year ago Encounter for screening mammogram for malignant neoplasm of breast   [place] Elsie Stain, MD               omeprazole (PRILOSEC) 40 MG capsule 180 capsule 1    Sig: TAKE 1 CAPSULE (40 MG TOTAL) BY MOUTH 2 (TWO) TIMES DAILY BEFORE A MEAL. TO REDUCE STOMACH ACID     Gastroenterology: Proton Pump Inhibitors Passed - 11/16/2022  3:35 PM      Passed - Valid encounter within last 12 months    Recent Outpatient Visits           10 months ago Chronic bilateral low back pain with left-sided sciatica   [place] Elsie Stain, MD   1 year ago Essential hypertension   [place] Elsie Stain, MD   1 year ago Essential hypertension   [place] Elsie Stain, MD   1 year ago Essential hypertension   [place] Elsie Stain, MD   1 year ago Encounter for screening mammogram for malignant neoplasm of breast   [place] Elsie Stain, MD               valsartan-hydrochlorothiazide (DIOVAN-HCT) 320-25 MG tablet 90 tablet 2    Sig: TAKE 1 TABLET BY MOUTH ONCE DAILY.     Cardiovascular: ARB + Diuretic Combos Failed - 11/16/2022  3:35 PM      Failed - K in normal range and within 180 days    Potassium  Date Value Ref Range Status  11/29/2020 4.4 3.5 - 5.2 mmol/L Final         Failed - Na in normal range and within 180 days    Sodium  Date Value Ref Range Status  11/29/2020 142 134 - 144 mmol/L Final         Failed - Cr in normal range and within 180 days    Creatinine, Ser  Date Value Ref Range Status  11/29/2020 0.81 0.57 - 1.00 mg/dL Final         Failed - eGFR is 10 or above and within 180 days    GFR calc Af Amer  Date Value Ref Range Status  03/21/2020 63 >59 mL/min/1.73 Final    Comment:    **[facility] currently reports eGFR in compliance with the current**   recommendations of the [employer]. [facility] will   update reporting as new guidelines are published from the NKF-ASN   Task force.    GFR calc non Af Amer  Date Value Ref Range Status  03/21/2020 55 (L) >59 mL/min/1.73 Final   eGFR  Date Value Ref Range Status  11/29/2020 80 >59 mL/min/1.73 Final         Failed - Valid encounter within last 6 months    Recent Outpatient Visits           10 months ago Chronic bilateral low back pain with left-sided sciatica   [place] Elsie Stain, MD  1 year ago Essential hypertension   [facility] Elsie Stain, MD   1 year ago Essential hypertension   [place] Elsie Stain, MD   1 year ago Essential hypertension   [place] Elsie Stain, MD   1 year ago Encounter for screening mammogram for malignant neoplasm of breast   [place] Elsie Stain, MD              Passed - Patient is not pregnant      Passed - Last BP in normal range    BP Readings from Last 1 Encounters:  01/01/22 132/84

## 2022-11-16 NOTE — Telephone Encounter (Signed)
Called patient via interpreter Prentiss Bells [phone] , to schedule appt for medication refills. No answer on [phone]. LVMTCB [phone]. [phone] tried first and VM box is full .

## 2022-11-19 ENCOUNTER — Other Ambulatory Visit: Payer: Self-pay

## 2022-11-20 ENCOUNTER — Other Ambulatory Visit: Payer: Self-pay

## 2023-04-30 ENCOUNTER — Other Ambulatory Visit (HOSPITAL_COMMUNITY): Payer: Self-pay

## 2024-02-05 NOTE — Progress Notes (Unsigned)
 Established Patient Office Visit  Subjective:  Patient ID: Savannah Reese, female    DOB: 05/26/1956  Age: 68 y.o. MRN: [id number]  CC:  No chief complaint on file.   HPI 08/30/21 Rexann Catalan presents for primary care follow-up note patient's interpreter was her daughter Landry Pinks who speaks fluent English and spoke to the patient in her native language [place].  Patient is here in return follow-up overall doing well with no real complaints.  On arrival blood pressure is good 107/75.  Patient maintains valsartan  HCT and atenolol  with good control blood pressure.  Patient is compliant with her other medications but does need refills on meloxicam  and atorvastatin .  Patient continues have low back pain is been seen by orthopedic spine they referred her to physical therapy but she is yet to hear from the physical therapy office.  Patient does need colon cancer screening and she agrees to receive the Cologuard kit for this.  She does have Medicaid   01/01/22 This a 68 year old female who is originally from [place] Patient seen by way of video [place] interpreter Lyle San [phone] At the last visit in December we try to get her physical therapy they tried calling there was no answer and no way to leave a voicemail.  Patient complains that no one called her for her physical therapy but I suspect there is been a breakdown of communication.  She does speak [place] but I do not think understands [place] and certainly does not understand English extremely well.  Her son who was to be with her today had to leave early and I saw her with the interpreter alone.  She had an MRI performed a year ago which showed severe lumbar disease they recommended injection she refused the injections so now physical therapy needs to occur.  Patient also wants to travel back to her country this June.  On arrival blood pressure is good 132/84.  She has been taking her blood pressure medications.  She does need refills.   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   Procedure: TOTAL KNEE ARTHROPLASTY;  Surgeon: Christie Cox, MD;  Location: [facility] OR;  Service: Orthopedics;  Laterality: Right;   TOTAL KNEE ARTHROPLASTY Left 05/28/2016   Procedure: TOTAL KNEE ARTHROPLASTY;  Surgeon: Christie Cox, MD;  Location: [facility] OR;  Service: Orthopedics;  Laterality: Left;    Family History  Problem Relation Age of Onset   Hypertension Mother    Hypertension Father     Social History   Socioeconomic History   Marital status: Single    Spouse name: Not on file   Number of children: Not on file   Years of education: Not on file   Highest education level: Not on file  Occupational History   Not on file  Tobacco Use   Smoking status: Never   Smokeless tobacco: Never  Vaping Use   Vaping status: Never Used  Substance and Sexual Activity   Alcohol use: No   Drug use: No   Sexual activity: Not Currently  Other Topics Concern   Not on file  Social History Narrative   ** Merged History Encounter **       Social Drivers of Dispensing optician  Resource Strain: Not on file  Food Insecurity: Not on file  Transportation Needs: Not on file  Physical Activity: Not on file  Stress: Not on file  Social Connections: Not on file  Intimate Partner Violence: Not on file    Outpatient Medications Prior to Visit  Medication Sig Dispense Refill   atenolol  (TENORMIN ) 100 MG tablet TAKE 1 TABLET (100 MG TOTAL) BY MOUTH ONCE DAILY. TO LOWER BLOOD PRESSURE 90 tablet 1   atorvastatin  (LIPITOR) 20 MG tablet TAKE 1 TABLET (20 MG TOTAL) BY MOUTH ONCE DAILY. TO LOWER CHOLESTEROL 90 tablet 1   Blood Pressure Monitoring (BLOOD PRESSURE MONITOR 7) DEVI Use to monitor blood pressure  1 each 0   meloxicam  (MOBIC ) 15 MG tablet Take 1 tablet (15 mg total) by mouth once daily. 30 tablet 0   omeprazole  (PRILOSEC) 40 MG capsule TAKE 1 CAPSULE (40 MG TOTAL) BY MOUTH 2 (TWO) TIMES DAILY BEFORE A MEAL. TO REDUCE STOMACH ACID 180 capsule 1   valsartan -hydrochlorothiazide  (DIOVAN -HCT) 320-25 MG tablet TAKE 1 TABLET BY MOUTH ONCE DAILY. 90 tablet 2   No facility-administered medications prior to visit.    No Known Allergies  ROS Review of Systems  Constitutional:  Negative for chills, diaphoresis and fever.  HENT:  Negative for congestion, hearing loss, nosebleeds, sore throat and tinnitus.   Eyes:  Negative for photophobia and redness.  Respiratory:  Negative for cough, shortness of breath, wheezing and stridor.   Cardiovascular:  Negative for chest pain, palpitations and leg swelling.  Gastrointestinal:  Negative for abdominal pain, blood in stool, constipation, diarrhea, nausea and vomiting.  Endocrine: Negative for polydipsia.  Genitourinary:  Negative for dysuria, flank pain, frequency, hematuria and urgency.  Musculoskeletal:  Positive for back pain. Negative for myalgias and neck pain.  Skin:  Negative for rash.  Allergic/Immunologic: Negative for environmental allergies.  Neurological:  Negative for dizziness, tremors, seizures, weakness and headaches.  Hematological:  Does not bruise/bleed easily.  Psychiatric/Behavioral:  Negative for suicidal ideas. The patient is not nervous/anxious.       Objective:    Physical Exam Vitals reviewed.  Constitutional:      Appearance: Normal appearance. She is well-developed. She is not diaphoretic.  HENT:     Head: Normocephalic and atraumatic.     Nose: No nasal deformity, septal deviation, mucosal edema or rhinorrhea.     Right Sinus: No maxillary sinus tenderness or frontal sinus tenderness.     Left Sinus: No maxillary sinus tenderness or frontal sinus tenderness.     Mouth/Throat:     Pharynx: No oropharyngeal  exudate.  Eyes:     General: No scleral icterus.    Conjunctiva/sclera: Conjunctivae normal.     Pupils: Pupils are equal, round, and reactive to light.  Neck:     Thyroid: No thyromegaly.     Vascular: No carotid bruit or JVD.     Trachea: Trachea normal. No tracheal tenderness or tracheal deviation.  Cardiovascular:     Rate and Rhythm: Normal rate and regular rhythm.     Chest Wall: PMI is not displaced.     Pulses: Normal pulses. No decreased pulses.     Heart sounds: Normal heart sounds, S1 normal and S2 normal. Heart sounds not distant. No murmur heard.    No systolic murmur is present.     No diastolic murmur is present.     No friction rub. No gallop. No S3 or S4 sounds.  Pulmonary:     Effort: No tachypnea,  accessory muscle usage or respiratory distress.     Breath sounds: No stridor. No decreased breath sounds, wheezing, rhonchi or rales.  Chest:     Chest wall: No tenderness.  Abdominal:     General: Bowel sounds are normal. There is no distension.     Palpations: Abdomen is soft. Abdomen is not rigid.     Tenderness: There is no abdominal tenderness. There is no guarding or rebound.  Musculoskeletal:        General: Normal range of motion.     Cervical back: Normal range of motion and neck supple. No edema, erythema or rigidity. No muscular tenderness. Normal range of motion.  Lymphadenopathy:     Head:     Right side of head: No submental or submandibular adenopathy.     Left side of head: No submental or submandibular adenopathy.     Cervical: No cervical adenopathy.  Skin:    General: Skin is warm and dry.     Coloration: Skin is not pale.     Findings: No rash.     Nails: There is no clubbing.  Neurological:     Mental Status: She is alert and oriented to person, place, and time.     Sensory: No sensory deficit.  Psychiatric:        Speech: Speech normal.        Behavior: Behavior normal.     There were no vitals taken for this visit. Wt Readings from  Last 3 Encounters:  01/01/22 204 lb 12.8 oz (92.9 kg)  08/30/21 205 lb 3.2 oz (93.1 kg)  04/03/21 212 lb (96.2 kg)     Health Maintenance Due  Topic Date Due   Zoster Vaccines- Shingrix (1 of 2) Never done   DEXA SCAN  Never done   COVID-19 Vaccine (3 - 2024-25 season) 05/05/2023   MAMMOGRAM  06/03/2023    There are no preventive care reminders to display for this patient.  Lab Results  Component Value Date   TSH 1.110 11/29/2020   Lab Results  Component Value Date   WBC 5.4 11/29/2020   HGB 14.0 11/29/2020   HCT 43.2 11/29/2020   MCV 88 11/29/2020   PLT 252 11/29/2020   Lab Results  Component Value Date   NA 142 11/29/2020   K 4.4 11/29/2020   CO2 23 11/29/2020   GLUCOSE 85 11/29/2020   BUN 12 11/29/2020   CREATININE 0.81 11/29/2020   BILITOT 0.3 11/29/2020   ALKPHOS 57 11/29/2020   AST 16 11/29/2020   ALT 10 11/29/2020   PROT 7.7 11/29/2020   ALBUMIN 4.2 11/29/2020   CALCIUM  9.5 11/29/2020   ANIONGAP 9 09/07/2018   EGFR 80 11/29/2020   Lab Results  Component Value Date   CHOL 207 (H) 11/29/2020   Lab Results  Component Value Date   HDL 56 11/29/2020   Lab Results  Component Value Date   LDLCALC 139 (H) 11/29/2020   Lab Results  Component Value Date   TRIG 66 11/29/2020   Lab Results  Component Value Date   CHOLHDL 3.7 11/29/2020   Lab Results  Component Value Date   HGBA1C 5.8 (A) 04/03/2021      Assessment & Plan:   Problem List Items Addressed This Visit   None   No orders of the defined types were placed in this encounter.   Follow-up: No follow-ups on file.    Arlene Lacy, MD

## 2024-02-06 ENCOUNTER — Encounter: Payer: Self-pay | Admitting: Critical Care Medicine

## 2024-02-06 ENCOUNTER — Ambulatory Visit: Attending: Critical Care Medicine | Admitting: Critical Care Medicine

## 2024-02-06 ENCOUNTER — Other Ambulatory Visit: Payer: Self-pay | Admitting: Critical Care Medicine

## 2024-02-06 ENCOUNTER — Other Ambulatory Visit: Payer: Self-pay

## 2024-02-06 VITALS — BP 146/89 | HR 70 | Temp 97.9°F | Ht 66.0 in | Wt 210.0 lb

## 2024-02-06 DIAGNOSIS — J209 Acute bronchitis, unspecified: Secondary | ICD-10-CM | POA: Insufficient documentation

## 2024-02-06 DIAGNOSIS — R7303 Prediabetes: Secondary | ICD-10-CM | POA: Diagnosis not present

## 2024-02-06 DIAGNOSIS — E785 Hyperlipidemia, unspecified: Secondary | ICD-10-CM | POA: Diagnosis not present

## 2024-02-06 DIAGNOSIS — E782 Mixed hyperlipidemia: Secondary | ICD-10-CM | POA: Diagnosis not present

## 2024-02-06 DIAGNOSIS — K219 Gastro-esophageal reflux disease without esophagitis: Secondary | ICD-10-CM | POA: Diagnosis not present

## 2024-02-06 DIAGNOSIS — I10 Essential (primary) hypertension: Secondary | ICD-10-CM | POA: Diagnosis not present

## 2024-02-06 LAB — POCT GLYCOSYLATED HEMOGLOBIN (HGB A1C): HbA1c, POC (prediabetic range): 5.8 % (ref 5.7–6.4)

## 2024-02-06 LAB — GLUCOSE, POCT (MANUAL RESULT ENTRY): POC Glucose: 120 mg/dL — AB (ref 70–99)

## 2024-02-06 MED ORDER — BLOOD PRESSURE KIT DEVI: 0 refills | Status: AC

## 2024-02-06 MED ORDER — OMEPRAZOLE 40 MG PO CPDR
DELAYED_RELEASE_CAPSULE | ORAL | 1 refills | Status: DC
  Filled 2024-02-06: qty 180, 90d supply, fill #0
  Filled 2024-04-27: qty 180, 90d supply, fill #1

## 2024-02-06 MED ORDER — AZITHROMYCIN 250 MG PO TABS
ORAL_TABLET | ORAL | 0 refills | Status: AC
  Filled 2024-02-06: qty 6, 5d supply, fill #0

## 2024-02-06 MED ORDER — ATORVASTATIN CALCIUM 20 MG PO TABS
ORAL_TABLET | ORAL | 1 refills | Status: DC
  Filled 2024-02-06: qty 90, 90d supply, fill #0
  Filled 2024-04-27: qty 90, 90d supply, fill #1

## 2024-02-06 MED ORDER — ATENOLOL 100 MG PO TABS
ORAL_TABLET | ORAL | 1 refills | Status: DC
  Filled 2024-02-06: qty 90, fill #0
  Filled 2024-02-07: qty 90, 90d supply, fill #0
  Filled 2024-04-27: qty 90, 90d supply, fill #1

## 2024-02-06 MED ORDER — PREDNISONE 10 MG PO TABS
40.0000 mg | ORAL_TABLET | Freq: Every day | ORAL | 0 refills | Status: AC
  Filled 2024-02-06: qty 12, 3d supply, fill #0

## 2024-02-06 MED ORDER — VALSARTAN-HYDROCHLOROTHIAZIDE 320-25 MG PO TABS
1.0000 | ORAL_TABLET | Freq: Every day | ORAL | 2 refills | Status: AC
  Filled 2024-02-06 (×2): qty 90, 90d supply, fill #0
  Filled 2024-04-27: qty 90, 90d supply, fill #1
  Filled 2024-08-04: qty 90, 90d supply, fill #2

## 2024-02-06 MED ORDER — AMLODIPINE BESYLATE 5 MG PO TABS
5.0000 mg | ORAL_TABLET | Freq: Every day | ORAL | 1 refills | Status: DC
  Filled 2024-02-06: qty 90, 90d supply, fill #0
  Filled 2024-04-27: qty 90, 90d supply, fill #1

## 2024-02-06 MED ORDER — BENZONATATE 100 MG PO CAPS
100.0000 mg | ORAL_CAPSULE | Freq: Two times a day (BID) | ORAL | 0 refills | Status: DC | PRN
  Filled 2024-02-06: qty 20, 10d supply, fill #0

## 2024-02-06 MED ORDER — MELOXICAM 15 MG PO TABS
15.0000 mg | ORAL_TABLET | Freq: Every day | ORAL | 0 refills | Status: DC
  Filled 2024-02-06: qty 30, 30d supply, fill #0

## 2024-02-06 NOTE — Assessment & Plan Note (Signed)
 Continue with statin

## 2024-02-06 NOTE — Assessment & Plan Note (Signed)
 Prescribe albuterol  pulse prednisone  and azithromycin

## 2024-02-06 NOTE — Assessment & Plan Note (Signed)
 Hypertension poorly controlled will begin valsartan  HCT and amlodipine  5 mg daily check labs return for blood pressure recheck

## 2024-02-06 NOTE — Patient Instructions (Addendum)
 Start amlodipine  1 daily for blood pressure and no other changes in other blood pressure medications  All medications refilled sent to pharmacy downstairs  A new blood pressure meter was prescribed sent to [facility] they will mail this to you  We will check regarding your mammogram and let you know if another 1 needs to be done sooner  Return in 3 weeks for a blood pressure recheck with our nurse  Return in 2 months for another provider visit to follow-up on your blood pressure medications  Take prednisone  4 daily for 3 days and azithromycin once a day for bronchitis and a cough medication benzonatate  was given for your cough  Commencez l'amlodipine  1 fois par jour pour la tension artrielle et aucun autre changement dans vos autres mdicaments antihypertenseurs.  Tous les mdicaments renouvels ont t envoys  [facility] [place].  Un nouveau tensiomtre a t prescrit et [employer]. Il vous sera envoy par courrier.  Nous vrifierons votre mammographie et vous informerons si une autre mammographie est ncessaire plus tt.  Revenez dans 3 semaines pour [employer] prise de tension artrielle avec notre infirmire.  Revenez dans 2 mois pour [employer] votre mdecin afin de suivre vos mdicaments antihypertenseurs.  Prenez de la prednisone  4 fois par jour pendant 3 jours et de l'azithromycine une fois par jour pour la bronchite. Un antitussif, le benzonatate , vous a t prescrit pour votre toux.

## 2024-02-07 ENCOUNTER — Other Ambulatory Visit: Payer: Self-pay

## 2024-02-07 LAB — LIPID PANEL

## 2024-02-10 ENCOUNTER — Ambulatory Visit: Payer: Self-pay | Admitting: Critical Care Medicine

## 2024-02-10 NOTE — Progress Notes (Signed)
Let patient know all labs normal

## 2024-02-11 ENCOUNTER — Other Ambulatory Visit: Payer: Self-pay

## 2024-02-11 LAB — LIPID PANEL
Chol/HDL Ratio: 3.9 ratio (ref 0.0–4.4)
Cholesterol, Total: 181 mg/dL (ref 100–199)
HDL: 47 mg/dL (ref >39)
LDL Chol Calc (NIH): 121 mg/dL — ABNORMAL HIGH (ref 0–99)
Triglycerides: 71 mg/dL (ref 0–149)
VLDL Cholesterol Cal: 13 mg/dL (ref 5–40)

## 2024-02-11 LAB — CBC WITH DIFFERENTIAL/PLATELET
Basophils Absolute: 0.1 10*3/uL (ref 0.0–0.2)
Basos: 1 %
EOS (ABSOLUTE): 0.1 10*3/uL (ref 0.0–0.4)
Eos: 2 %
Hematocrit: 41.4 % (ref 34.0–46.6)
Hemoglobin: 13.2 g/dL (ref 11.1–15.9)
Immature Grans (Abs): 0 10*3/uL (ref 0.0–0.1)
Immature Granulocytes: 0 %
Lymphocytes Absolute: 2.3 10*3/uL (ref 0.7–3.1)
Lymphs: 37 %
MCH: 28.6 pg (ref 26.6–33.0)
MCHC: 31.9 g/dL (ref 31.5–35.7)
MCV: 90 fL (ref 79–97)
Monocytes Absolute: 0.5 10*3/uL (ref 0.1–0.9)
Monocytes: 9 %
Neutrophils Absolute: 3.1 10*3/uL (ref 1.4–7.0)
Neutrophils: 51 %
Platelets: 286 10*3/uL (ref 150–450)
RBC: 4.62 x10E6/uL (ref 3.77–5.28)
RDW: 13.1 % (ref 11.7–15.4)
WBC: 6.1 10*3/uL (ref 3.4–10.8)

## 2024-02-11 LAB — COMPREHENSIVE METABOLIC PANEL WITH GFR
ALT: 10 IU/L (ref 0–32)
AST: 15 IU/L (ref 0–40)
Alkaline Phosphatase: 65 IU/L (ref 44–40)
BUN/Creatinine Ratio: 24 (ref 12–28)
BUN: 23 mg/dL (ref 8–27)
Bilirubin Total: <65 mg/dL (ref 44–121)
Calcium: 22 mmol/L (ref 8.7–29)
Chloride: 102 mmol/L (ref 96–29)
Creatinine, Ser: 0.96 mg/dL (ref 0.57–1.00)
Globulin, Total: 4.1 g/dL (ref 3.9–4.9)
Globulin, Total: <3.2 mg/dL (ref 1.5–4.5)
Glucose: 97 mg/dL (ref 70–99)
Potassium: 102 mmol/L (ref 96–5.2)
Sodium: 140 mmol/L (ref 3.5–5.2)
Total Protein: 7.3 g/dL (ref 6.0–8.5)
Total Protein: 9.8 mg/dL (ref 8.7–8.5)
eGFR: 64 mL/min/{1.73_m2} (ref >59)

## 2024-02-21 NOTE — Telephone Encounter (Signed)
 FYI

## 2024-02-21 NOTE — Telephone Encounter (Signed)
 Copied from CRM [id number]. Topic: Clinical - Lab/Test Results >> Feb 21, 2024  3:00 PM Zipporah Him wrote:  Reason for CRM: Patient called in and got lab results, read note verbatim. No additional questions.

## 2024-03-23 DIAGNOSIS — H25813 Combined forms of age-related cataract, bilateral: Secondary | ICD-10-CM | POA: Diagnosis not present

## 2024-04-07 ENCOUNTER — Telehealth: Payer: Self-pay | Admitting: Family Medicine

## 2024-04-07 NOTE — Telephone Encounter (Signed)
 Pt confirmed appt per son 8/5

## 2024-04-08 ENCOUNTER — Other Ambulatory Visit: Payer: Self-pay

## 2024-04-08 ENCOUNTER — Ambulatory Visit: Attending: Family Medicine | Admitting: Family Medicine

## 2024-04-08 ENCOUNTER — Encounter: Payer: Self-pay | Admitting: Family Medicine

## 2024-04-08 VITALS — BP 120/79 | HR 78 | Ht 66.0 in | Wt 213.8 lb

## 2024-04-08 DIAGNOSIS — G8929 Other chronic pain: Secondary | ICD-10-CM | POA: Diagnosis not present

## 2024-04-08 DIAGNOSIS — G44209 Tension-type headache, unspecified, not intractable: Secondary | ICD-10-CM

## 2024-04-08 DIAGNOSIS — M5442 Lumbago with sciatica, left side: Secondary | ICD-10-CM

## 2024-04-08 DIAGNOSIS — I10 Essential (primary) hypertension: Secondary | ICD-10-CM | POA: Diagnosis not present

## 2024-04-08 DIAGNOSIS — E2839 Other primary ovarian failure: Secondary | ICD-10-CM | POA: Diagnosis not present

## 2024-04-08 DIAGNOSIS — Z1231 Encounter for screening mammogram for malignant neoplasm of breast: Secondary | ICD-10-CM | POA: Diagnosis not present

## 2024-04-08 MED ORDER — MELOXICAM 15 MG PO TABS
15.0000 mg | ORAL_TABLET | Freq: Every day | ORAL | 2 refills | Status: AC
  Filled 2024-04-08: qty 30, 30d supply, fill #0

## 2024-04-08 MED ORDER — MISC. DEVICES MISC
0 refills | Status: AC
Start: 2024-04-08 — End: ?

## 2024-04-08 MED ORDER — TIZANIDINE HCL 4 MG PO TABS
4.0000 mg | ORAL_TABLET | Freq: Three times a day (TID) | ORAL | 1 refills | Status: AC | PRN
  Filled 2024-04-08: qty 60, 20d supply, fill #0

## 2024-04-08 NOTE — Progress Notes (Signed)
 Subjective:  Patient ID: Savannah Reese, female    DOB: 07-24-1956  Age: 68 y.o. MRN: [id number]  CC: Medical Management of Chronic Issues (Headaches/Back pain)     Discussed the use of AI scribe software for clinical note transcription with the patient, who gave verbal consent to proceed.  Visit was conducted with the aid of an [employer] video interpreter.  History of Present Illness Savannah Reese is a 68 year old female with history of hypertension, hyperlipidemia, prediabetes, chronic low back pain with left-sided sciatica, history of bilateral knee surgeries who presents with worsening back symptoms and headaches.  She experiences chronic back pain near the left hip, radiating to the left foot, with intermittent numbness and tingling. Ibuprofen  is ineffective, and she previously used meloxicam  but has run out of refills. She has undergone procedures on both knees.  Headaches began in June, primarily on the left side, intermittent, moderate in intensity, and associated with hot weather. They are not affected by light or sound, and there are no sinus symptoms. She has a history of hypertension since 1983 but did not commonly experience headaches before. She sleeps well without disturbances.  Initial blood pressure was elevated.  She has no neck pain, nausea or vomiting.    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/05/2014   Procedure: TOTAL KNEE ARTHROPLASTY;  Surgeon: Marcey Raman, MD;  Location: [facility];  Service: Orthopedics;  Laterality: Right;   TOTAL KNEE ARTHROPLASTY Left 05/28/2016   Procedure: TOTAL KNEE ARTHROPLASTY;  Surgeon: Marcey Raman, MD;  Location: [facility];  Service: Orthopedics;  Laterality: Left;    Family History  Problem Relation Age of Onset   Hypertension Mother    Hypertension Father     Social History    Socioeconomic History   Marital status: Single    Spouse name: Not on file   Number of children: Not on file   Years of education: Not on file   Highest education level: Not on file  Occupational History   Not on file  Tobacco Use   Smoking status: Never   Smokeless tobacco: Never  Vaping Use   Vaping status: Never Used  Substance and Sexual Activity   Alcohol use: No   Drug use: No   Sexual activity: Not Currently  Other Topics Concern   Not on file  Social History Narrative   ** Merged History Encounter **       Social Drivers of Health   Financial Resource Strain: Not on file  Food Insecurity: Not on file  Transportation Needs: Not on file  Physical Activity: Not on file  Stress: Not on file  Social Connections: Not on file    No Known Allergies  Outpatient Medications Prior to Visit  Medication Sig Dispense Refill   amLODipine  (NORVASC ) 5 MG tablet Take 1 tablet (5 mg total) by mouth daily. 90 tablet 1   atenolol  (TENORMIN ) 100 MG tablet TAKE 1 TABLET (100 MG TOTAL) BY MOUTH ONCE DAILY. TO LOWER BLOOD PRESSURE 90 tablet 1   atorvastatin  (LIPITOR) 20 MG tablet TAKE 1 TABLET (20 MG TOTAL) BY MOUTH ONCE DAILY TO LOWER CHOLESTEROL 90 tablet 1   Blood Pressure Monitoring (BLOOD PRESSURE KIT) DEVI Use to measure blood pressure 1 each 0   omeprazole  (PRILOSEC) 40 MG capsule TAKE 1 CAPSULE (40  MG TOTAL) BY MOUTH 2 (TWO) TIMES DAILY BEFORE A MEAL. TO REDUCE STOMACH ACID. 180 capsule 1   valsartan -hydrochlorothiazide  (DIOVAN -HCT) 320-25 MG tablet Take 1 tablet by mouth daily. 90 tablet 2   meloxicam  (MOBIC ) 15 MG tablet Take 1 tablet (15 mg total) by mouth once daily. 30 tablet 0   benzonatate  (TESSALON ) 100 MG capsule Take 1 capsule (100 mg total) by mouth 2 (two) times daily as needed for cough. (Patient not taking: Reported on 04/08/2024) 20 capsule 0   predniSONE  (DELTASONE ) 10 MG tablet Take 4 tablets (40 mg total) by mouth daily FOR 3 DAYS THEN STOP. (Patient not  taking: Reported on 04/08/2024) 12 tablet 0   No facility-administered medications prior to visit.     ROS Review of Systems  Constitutional:  Negative for activity change and appetite change.  HENT:  Negative for sinus pressure and sore throat.   Respiratory:  Negative for chest tightness, shortness of breath and wheezing.   Cardiovascular:  Negative for chest pain and palpitations.  Gastrointestinal:  Negative for abdominal distention, abdominal pain and constipation.  Genitourinary: Negative.   Musculoskeletal:  Positive for back pain.  Neurological:  Positive for headaches.  Psychiatric/Behavioral:  Negative for behavioral problems and dysphoric mood.     Objective:  BP 120/79   Pulse 78   Ht 5' 6 (1.676 m)   Wt 213 lb 12.8 oz (97 kg)   SpO2 97%   BMI 34.51 kg/m      04/08/2024   11:08 AM 04/08/2024   10:26 AM 02/06/2024   10:40 AM  BP/Weight  Systolic BP 120 152 146  Diastolic BP 79 87 89  Wt. (Lbs)  213.8   BMI  34.51 kg/m2       Physical Exam Constitutional:      Appearance: She is well-developed.  HENT:     Head:     Comments: No sinus tenderness    Right Ear: Tympanic membrane normal.     Left Ear: Tympanic membrane normal.  Cardiovascular:     Rate and Rhythm: Normal rate.     Heart sounds: Normal heart sounds. No murmur heard. Pulmonary:     Effort: Pulmonary effort is normal.     Breath sounds: Normal breath sounds. No wheezing or rales.  Chest:     Chest wall: No tenderness.  Abdominal:     General: Bowel sounds are normal. There is no distension.     Palpations: Abdomen is soft. There is no mass.     Tenderness: There is no abdominal tenderness.  Musculoskeletal:        General: Normal range of motion.     Right lower leg: No edema.     Left lower leg: No edema.     Comments: Slight tenderness on palpation of left side of back.  Positive straight leg raise on the left. Negative straight leg raise on the right   Neurological:     Mental  Status: She is alert and oriented to person, place, and time.  Psychiatric:        Mood and Affect: Mood normal.        Latest Ref Rng & Units 02/06/2024   11:01 AM 11/29/2020   12:00 PM 03/21/2020   10:39 AM  CMP  Glucose 70 - 99 mg/dL 97  85  879   BUN 8 - 27 mg/dL 23  12  23    Creatinine 0.57 - 1.00 mg/dL 9.03  9.18  8.93  Sodium 134 - 144 mmol/L 140  142  137   Potassium 3.5 - 5.2 mmol/L 4.6  4.4  4.4   Chloride 96 - 106 mmol/L 102  104  100   CO2 20 - 29 mmol/L 22  23  24    Calcium  8.7 - 10.3 mg/dL 9.8  9.5  89.7   Total Protein 6.0 - 8.5 g/dL 7.3  7.7  7.9   Total Bilirubin 0.0 - 1.2 mg/dL <9.7  0.3  <9.7   Alkaline Phos 44 - 121 IU/L 65  57  69   AST 0 - 40 IU/L 15  16  17    ALT 0 - 32 IU/L 10  10  9      Lipid Panel     Component Value Date/Time   CHOL 181 02/06/2024 1101   TRIG 71 02/06/2024 1101   HDL 47 02/06/2024 1101   CHOLHDL 3.9 02/06/2024 1101   LDLCALC 121 (H) 02/06/2024 1101    CBC    Component Value Date/Time   WBC 6.1 02/06/2024 1101   WBC 6.9 09/07/2018 0303   RBC 4.62 02/06/2024 1101   RBC 4.45 09/07/2018 0303   HGB 13.2 02/06/2024 1101   HCT 41.4 02/06/2024 1101   PLT 286 02/06/2024 1101   MCV 90 02/06/2024 1101   MCH 28.6 02/06/2024 1101   MCH 28.3 09/07/2018 0303   MCHC 31.9 02/06/2024 1101   MCHC 31.0 09/07/2018 0303   RDW 13.1 02/06/2024 1101   LYMPHSABS 2.3 02/06/2024 1101   MONOABS 0.6 09/07/2018 0303   EOSABS 0.1 02/06/2024 1101   BASOSABS 0.1 02/06/2024 1101    Lab Results  Component Value Date   HGBA1C 5.8 02/06/2024       Assessment & Plan Chronic left-sided low back and hip pain with sciatica Chronic pain with radiculopathy from hip to foot. Ibuprofen  ineffective, meloxicam  completed. - Refilled meloxicam  prescription. - Prescribed muscle relaxant as needed, caution for drowsiness. - Referred to physical therapy for back pain management.  Hypertension Hypertension since 1983. Initial elevated blood pressure  normalized on repeat. Headaches may be related to hypertension. No home blood pressure monitor. - Rechecked blood pressure during visit and this returned normal. - If elevated, adjust antihypertensive medication dose. - Prescribed blood pressure monitor to be obtained from medical equipment store.  Tension-type headache Moderate, intermittent tension-type headache, possibly linked to hot weather or hypertension. No sinus symptoms.  Low suspicion for migraine - Recommended adequate fluid intake and rest. - Advised use of meloxicam  or ibuprofen  for headache relief.   Healthcare maintenance/screening for breast cancer/estrogen deficiency -Referred for mammogram and bone density study  Meds ordered this encounter  Medications   meloxicam  (MOBIC ) 15 MG tablet    Sig: Take 1 tablet (15 mg total) by mouth once daily.    Dispense:  30 tablet    Refill:  2   tiZANidine  (ZANAFLEX ) 4 MG tablet    Sig: Take 1 tablet (4 mg total) by mouth every 8 (eight) hours as needed.    Dispense:  60 tablet    Refill:  1   Misc. Devices MISC    Sig: Blood pressure monitor. Diagnosis: Hypertension    Dispense:  1 each    Refill:  0    Follow-up: Return in about 6 months (around 10/09/2024) for Chronic medical conditions.       [name] Sabin, MD, FAAFP. [facility] [place], [place] [phone]   04/08/2024, 11:28 AM

## 2024-04-08 NOTE — Patient Instructions (Signed)
 [employer] du [employer] exercices suivants permettent de renforcer les muscles qui aident  soutenir le tronc (torse) et [employer]. Ils aident galement  garder [employer] Incorporated flexible. La pratique de ces exercices peut aider  prvenir ou diminuer les douleurs au bas [employer]. Si vous souffrez de Counselling psychologist ou d'inconfort, essayez de Corporate treasurer exercices 2  3 fois par jour ou selon les directives de votre prestataire de soins de sant.  mesure que votre douleur diminue, faites-les une fois par jour, [employer] de fois que vous rptez les tapes pour Cisco exercice (faites davantage de rptitions). Pour viter que la douleur dorsale revienne, continuez  faire ces exercices une fois par jour ou selon les directives de votre prestataire de soins de sant. Effectuez les exercices en suivant scrupuleusement les instructions de votre prestataire de soins de sant et Occupational hygienist. Il est normal de ressentir de lgers tirements, tiraillements, tensions ou gne lors de la pratique de [employer], mais vous devez arrter immdiatement si vous ressentez une douleur soudaine ou [employer]. Exercices Genou repli contre la poitrine Rptez les tapes ci-aprs 3  5 fois pour chaque jambe : [employer] sur [employer] Materials un lit ferme ou sur le sol avec les jambes tendues. Amenez un genou pli contre la poitrine. L'autre jambe doit rester bien tendue et en contact So[employer] Corporation sol. Maintenez cette position en tenant votre genou ou votre cuisse de vos deux mains. Tirez votre genou vers vous jusqu' ce que vous sentiez Educational psychologist bas du dos ou les fesses. Maintenez l'tirement pendant 10  30 secondes. Relchez et tendez votre jambe lentement.  Inclinaison du bassin Rptez les tapes ci-aprs 5  10 fois : Allongez-vous sur Applied Materials un lit ferme ou sur le sol avec les [place] tendues. Pliez vos genoux en les orientant vers le  plafond et gardez les pieds bien  plat sur le sol. Serrez vos muscles abdominaux infrieurs pour [employer] bas de [employer] Corporation sol. Ce mouvement permettra d'incliner votre bassin et Proofreader votre coccyx vers le haut plutt que vers vos pieds ou le sol. Maintenez cette position pendant 5  10 secondes en exerant une lgre tension et en respirant rgulirement.  Posture du chat/de la vache Rptez les tapes ci-aprs jusqu' ce que le bas de votre dos devienne plus flexible : Mettez-vous  quatre pattes en posant vos genoux par terre ou sur un lit ferme. Gardez vos mains sous vos paules et vos genoux sous vos hanches. Vous pouvez placer un coussin sous vos genoux pour plus de confort. Relchez votre tte vers la poitrine. Contractez vos abdominaux et dirigez votre coccyx vers le sol afin que le bas de votre dos s'arrondisse comme [employer]. Maintenez cette position pendant 5 secondes. Soulevez lentement la tte, relchez vos abdominaux et dirigez votre coccyx vers le plafond de sorte que votre dos forme une arche affaisse comme le dos [place]. Maintenez cette position pendant 5 secondes.  Pompes Rptez les tapes ci-aprs 5  10 fois : Allongez-vous sur le ventre (face contre terre) par terre ou sur un lit ferme. Placez vos paumes prs de votre tte,  peu prs au niveau des paules. En gardant State [employer] Corporation dtendu que possible et vos hanches sur le sol, tendez lentement vos bras pour lever la moiti suprieure de Fifth [employer] Banc[employer] et soulever vos paules. N'utilisez pas vos muscles [employer]  lever le haut de votre torse. Vous pouvez ajuster la position de vos mains pour plus de confort. Maintenez cette position pendant 5 secondes tout en gardant Constellation Energy dtendu. Rallongez-vous lentement sur le sol.  Ponts Rptez les tapes ci-aprs 10 fois : Allongez-vous sur Publix par terre ou sur un lit ferme. Pliez vos genoux en les orientant vers le plafond et  gardez les pieds bien  plat sur le sol. Vos bras doivent tre poss  plat le long de [employer]anc[employer]. Serrez vos muscles fessiers et BJ's vos fesses du sol jusqu' ce que votre taille soit  peu prs  la mme hauteur que vos genoux. Vous devez sentir vos muscles fessiers et ceux de l'arrire de vos cuisses travailler. Si vous ne sentez pas ces muscles travailler, loignez vos pieds de vos fesses d'environ 2,5  5 cm (1  2 pouces). Maintenez cette position pendant 3  5 secondes. Abaissez lentement vos hanches  la position de dpart, et laissez vos muscles fessiers se dtendre compltement. Si cet exercice vous semble trop facile, essayez de American Express bras croiss sur votre poitrine. Abdominaux par enroulement vertbral Rptez les tapes ci-aprs 5  10 fois : Allongez-vous sur Applied Materials un lit ferme ou sur le sol avec les jambes tendues. Pliez vos genoux en les orientant vers le plafond et gardez les pieds bien  plat sur le sol. Croisez les bras sur la poitrine. Inclinez lgrement le menton vers la poitrine sans Research scientist (life sciences) cou. Serrez vos muscles abdominaux et BJ's lentement votre torse assez haut pour dcoller lgrement vos omoplates du sol. vitez de Secretary/administrator torse plus haut pour viter de mettre trop de tension sur le bas de Constellation Energy. De plus, vous ne renforcerez pas vos muscles abdominaux de cette faon. Revenez lentement  votre position de dpart.  Extensions dorsales Rptez les tapes ci-aprs 5  10 fois : Allongez-vous sur Geographical information systems officer (face contre terre) avec les bras le long du corps, et Sales executive front sur le sol. Serrez les muscles des Automatic Data. Soulevez lentement votre poitrine du sol tout en gardant vos hanches appuyes contre le sol. Gardez l'arrire de votre tte dans l'alignement de la courbe de Constellation Energy. Vos yeux devraient regarder le sol. Maintenez cette position pendant 3  5 secondes. Revenez lentement  votre position de  dpart.  Prenez Sales promotion account executive de soins de sant si : Votre douleur dorsale ou votre inconfort empire lorsque vous faites des Special educational needs teacher. La douleur dorsale ou l'inconfort engendr par les exercices ne diminue pas dans les 2 heures suivant l'exercice. Si l'un ou l'autre se produit, arrtez de [employer]. Ne refaites pas ces exercices tant que votre prestataire de soins de sant ne vous y a pas autoris(e). Demandez immdiatement de l'aide si : Vous prouvez une douleur dorsale soudaine et forte. Si cela se produit, arrtez de [employer]. Ne refaites pas ces exercices tant que votre prestataire de soins de sant ne vous y a pas autoris(e). Ces conseils et renseignements ne sauraient se substituer  l'avis mdical de votre prestataire de soins de sant. Par consquent, il est primordial de parler de toutes vos proccupations avec votre prestataire de soins de sant. Document Revised: 03/10/2021 Document Reviewed: 03/10/2021 Elsevier Patient Education  2024 [employer].

## 2024-04-09 DIAGNOSIS — H25812 Combined forms of age-related cataract, left eye: Secondary | ICD-10-CM | POA: Diagnosis not present

## 2024-04-09 DIAGNOSIS — I10 Essential (primary) hypertension: Secondary | ICD-10-CM | POA: Diagnosis not present

## 2024-04-27 ENCOUNTER — Other Ambulatory Visit: Payer: Self-pay | Admitting: Critical Care Medicine

## 2024-04-27 ENCOUNTER — Other Ambulatory Visit: Payer: Self-pay

## 2024-04-27 ENCOUNTER — Other Ambulatory Visit (HOSPITAL_BASED_OUTPATIENT_CLINIC_OR_DEPARTMENT_OTHER): Payer: Self-pay

## 2024-04-28 ENCOUNTER — Other Ambulatory Visit: Payer: Self-pay

## 2024-04-28 NOTE — Telephone Encounter (Signed)
 Requested medications are due for refill today.  unsure  Requested medications are on the active medications list.  yes  Last refill. 02/06/2024  Future visit scheduled.   yes  Notes to clinic.  Prednisone  is not delegated. Tessalon  is not usually a maintenance medication.    Requested Prescriptions  Pending Prescriptions Disp Refills   benzonatate  (TESSALON ) 100 MG capsule 20 capsule 0    Sig: Take 1 capsule (100 mg total) by mouth 2 (two) times daily as needed for cough.     Ear, Nose, and Throat:  Antitussives/Expectorants Passed - 04/28/2024  4:45 PM      Passed - Valid encounter within last 12 months    Recent Outpatient Visits           2 weeks ago Essential hypertension   [facility] [place]. [facility] Sav[place] Clam, MD   2 months ago Essential hypertension   [facility] [place] [facility] [place]. [facility] Sav[place] Belvie BRAVO, MD   2 years ago Chronic bilateral low back pain with left-sided sciatica   [facility] [place]. [facility] Sav[facility] Belvie BRAVO, MD   2 years ago Essential hypertension   [facility] [place] [facility] [place]. [facility] Sav[place] Belvie BRAVO, MD   3 years ago Essential hypertension   [facility] [place] [facility] [place]. [facility] Sav[facility] Belvie BRAVO, MD               predniSONE  (DELTASONE ) 10 MG tablet 12 tablet 0    Sig: Take 4 tablets (40 mg total) by mouth daily FOR 3 DAYS THEN STOP.     Not Delegated - Endocrinology:  Oral Corticosteroids Failed - 04/28/2024  4:45 PM      Failed - This refill cannot be delegated      Failed - Manual Review: Eye exam for IOP if prolonged treatment      Failed - Bone Mineral Density or Dexa Scan completed in the last 2 years      Passed - Glucose (serum) in normal range and within 180 days    Glucose  Date Value Ref Range Status  02/06/2024  97 70 - 99 mg/dL Final   Glucose, Bld  Date Value Ref Range Status  09/07/2018 108 (H) 70 - 99 mg/dL Final   POC Glucose  Date Value Ref Range Status  02/06/2024 120 (A) 70 - 99 mg/dl Final         Passed - K in normal range and within 180 days    Potassium  Date Value Ref Range Status  02/06/2024 4.6 3.5 - 5.2 mmol/L Final         Passed - Na in normal range and within 180 days    Sodium  Date Value Ref Range Status  02/06/2024 140 134 - 144 mmol/L Final         Passed - Last BP in normal range    BP Readings from Last 1 Encounters:  04/08/24 120/79         Passed - Valid encounter within last 6 months    Recent Outpatient Visits           2 weeks ago Essential hypertension   [facility] [place]. [facility] Reese,  [name], MD   2 months ago Essential hypertension   [facility] [place]. [facility] Sav[facility] Belvie BRAVO, MD   2 years ago Chronic bilateral low back pain with left-sided sciatica   [facility] [place]. [facility] Sav[facility] Belvie BRAVO, MD   2 years ago Essential hypertension   [facility] [place] [facility] [place]. [facility] Sav[facility] Belvie BRAVO, MD   3 years ago Essential hypertension   [facility] [place] [facility] [place]. [facility] Savannah Belvie BRAVO, MD

## 2024-04-29 ENCOUNTER — Other Ambulatory Visit: Payer: Self-pay

## 2024-04-29 MED ORDER — BENZONATATE 100 MG PO CAPS
100.0000 mg | ORAL_CAPSULE | Freq: Two times a day (BID) | ORAL | 0 refills | Status: AC | PRN
  Filled 2024-04-29: qty 20, 10d supply, fill #0

## 2024-04-30 ENCOUNTER — Other Ambulatory Visit: Payer: Self-pay

## 2024-05-11 ENCOUNTER — Other Ambulatory Visit: Payer: Self-pay

## 2024-05-11 MED ORDER — KETOROLAC TROMETHAMINE 0.5 % OP SOLN
1.0000 [drp] | Freq: Four times a day (QID) | OPHTHALMIC | 0 refills | Status: DC
  Filled 2024-05-11: qty 5, 25d supply, fill #0

## 2024-05-21 ENCOUNTER — Other Ambulatory Visit: Payer: Self-pay

## 2024-05-21 DIAGNOSIS — I10 Essential (primary) hypertension: Secondary | ICD-10-CM | POA: Diagnosis not present

## 2024-05-21 DIAGNOSIS — H25811 Combined forms of age-related cataract, right eye: Secondary | ICD-10-CM | POA: Diagnosis not present

## 2024-05-21 MED ORDER — KETOROLAC TROMETHAMINE 0.5 % OP SOLN
1.0000 [drp] | Freq: Four times a day (QID) | OPHTHALMIC | 0 refills | Status: AC
  Filled 2024-05-21: qty 5, 25d supply, fill #0

## 2024-06-18 ENCOUNTER — Other Ambulatory Visit: Payer: Self-pay

## 2024-07-10 ENCOUNTER — Other Ambulatory Visit: Payer: Self-pay | Admitting: Critical Care Medicine

## 2024-07-10 ENCOUNTER — Other Ambulatory Visit: Payer: Self-pay

## 2024-07-10 DIAGNOSIS — I10 Essential (primary) hypertension: Secondary | ICD-10-CM

## 2024-07-10 DIAGNOSIS — K219 Gastro-esophageal reflux disease without esophagitis: Secondary | ICD-10-CM

## 2024-07-10 DIAGNOSIS — E785 Hyperlipidemia, unspecified: Secondary | ICD-10-CM

## 2024-07-10 MED ORDER — ATORVASTATIN CALCIUM 20 MG PO TABS
20.0000 mg | ORAL_TABLET | Freq: Every day | ORAL | 1 refills | Status: AC
  Filled 2024-07-10: qty 90, fill #0
  Filled 2024-07-13 – 2024-08-05 (×3): qty 90, 90d supply, fill #0

## 2024-07-10 MED ORDER — ATENOLOL 100 MG PO TABS
100.0000 mg | ORAL_TABLET | Freq: Every day | ORAL | 1 refills | Status: AC
  Filled 2024-07-10: qty 90, fill #0
  Filled 2024-07-13 – 2024-08-05 (×4): qty 90, 90d supply, fill #0

## 2024-07-10 MED ORDER — OMEPRAZOLE 40 MG PO CPDR
40.0000 mg | DELAYED_RELEASE_CAPSULE | Freq: Two times a day (BID) | ORAL | 1 refills | Status: AC
  Filled 2024-07-10: qty 180, fill #0
  Filled 2024-07-13 – 2024-08-05 (×3): qty 180, 90d supply, fill #0

## 2024-07-10 MED ORDER — AMLODIPINE BESYLATE 5 MG PO TABS
5.0000 mg | ORAL_TABLET | Freq: Every day | ORAL | 1 refills | Status: AC
  Filled 2024-07-10 – 2024-08-05 (×4): qty 90, 90d supply, fill #0

## 2024-07-13 ENCOUNTER — Other Ambulatory Visit: Payer: Self-pay

## 2024-07-15 ENCOUNTER — Other Ambulatory Visit: Payer: Self-pay

## 2024-07-16 ENCOUNTER — Other Ambulatory Visit: Payer: Self-pay

## 2024-07-23 ENCOUNTER — Other Ambulatory Visit: Payer: Self-pay

## 2024-07-24 ENCOUNTER — Other Ambulatory Visit: Payer: Self-pay

## 2024-07-27 ENCOUNTER — Other Ambulatory Visit: Payer: Self-pay

## 2024-08-03 ENCOUNTER — Other Ambulatory Visit: Payer: Self-pay

## 2024-08-04 ENCOUNTER — Other Ambulatory Visit: Payer: Self-pay

## 2024-08-05 ENCOUNTER — Other Ambulatory Visit (HOSPITAL_COMMUNITY): Payer: Self-pay

## 2024-08-05 ENCOUNTER — Other Ambulatory Visit: Payer: Self-pay

## 2024-08-06 ENCOUNTER — Other Ambulatory Visit: Payer: Self-pay

## 2024-09-22 ENCOUNTER — Telehealth: Payer: Self-pay | Admitting: Family Medicine

## 2024-09-22 NOTE — Telephone Encounter (Signed)
 1st attempt resch appt mailbox full ( pcp will not be in the office.SABRA if the pt calls please rech appt)

## 2024-09-25 ENCOUNTER — Telehealth: Payer: Self-pay | Admitting: Family Medicine

## 2024-09-25 NOTE — Telephone Encounter (Signed)
 2nd attempt mailbox full to leave a message to resch appt due to pcp not in the office

## 2024-10-12 ENCOUNTER — Ambulatory Visit: Admitting: Family Medicine
